# Patient Record
Sex: Male | Born: 2000 | Race: White | Hispanic: No | Marital: Single | Smoking: Current every day smoker
Health system: Southern US, Community
[De-identification: ages and names within clinical notes are randomized; demographics above are authoritative.]

## PROBLEM LIST (undated history)

## (undated) DIAGNOSIS — E785 Hyperlipidemia, unspecified: Secondary | ICD-10-CM

## (undated) DIAGNOSIS — T7840XA Allergy, unspecified, initial encounter: Secondary | ICD-10-CM

## (undated) DIAGNOSIS — I10 Essential (primary) hypertension: Secondary | ICD-10-CM

## (undated) DIAGNOSIS — K5900 Constipation, unspecified: Secondary | ICD-10-CM

## (undated) DIAGNOSIS — F909 Attention-deficit hyperactivity disorder, unspecified type: Secondary | ICD-10-CM

## (undated) DIAGNOSIS — F32A Depression, unspecified: Secondary | ICD-10-CM

## (undated) DIAGNOSIS — F319 Bipolar disorder, unspecified: Secondary | ICD-10-CM

## (undated) DIAGNOSIS — E669 Obesity, unspecified: Secondary | ICD-10-CM

## (undated) DIAGNOSIS — F4310 Post-traumatic stress disorder, unspecified: Secondary | ICD-10-CM

## (undated) DIAGNOSIS — F4010 Social phobia, unspecified: Secondary | ICD-10-CM

## (undated) DIAGNOSIS — F419 Anxiety disorder, unspecified: Secondary | ICD-10-CM

## (undated) DIAGNOSIS — F4000 Agoraphobia, unspecified: Secondary | ICD-10-CM

## (undated) DIAGNOSIS — J45909 Unspecified asthma, uncomplicated: Secondary | ICD-10-CM

## (undated) DIAGNOSIS — F329 Major depressive disorder, single episode, unspecified: Secondary | ICD-10-CM

## (undated) HISTORY — DX: Social phobia, unspecified: F40.10

## (undated) HISTORY — DX: Post-traumatic stress disorder, unspecified: F43.10

## (undated) HISTORY — DX: Agoraphobia, unspecified: F40.00

## (undated) HISTORY — PX: TONSILLECTOMY: SUR1361

## (undated) HISTORY — DX: Bipolar disorder, unspecified: F31.9

## (undated) HISTORY — DX: Anxiety disorder, unspecified: F41.9

## (undated) HISTORY — DX: Hyperlipidemia, unspecified: E78.5

## (undated) HISTORY — DX: Essential (primary) hypertension: I10

## (undated) HISTORY — DX: Depression, unspecified: F32.A

## (undated) HISTORY — PX: CIRCUMCISION: SUR203

---
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
## 2021-01-25 DIAGNOSIS — F4011 Social phobia, generalized: Secondary | ICD-10-CM | POA: Diagnosis not present

## 2021-01-25 DIAGNOSIS — F913 Oppositional defiant disorder: Secondary | ICD-10-CM | POA: Diagnosis not present

## 2021-01-25 DIAGNOSIS — F411 Generalized anxiety disorder: Secondary | ICD-10-CM | POA: Diagnosis not present

## 2021-01-25 DIAGNOSIS — R454 Irritability and anger: Secondary | ICD-10-CM | POA: Diagnosis not present

## 2021-01-25 DIAGNOSIS — F5105 Insomnia due to other mental disorder: Secondary | ICD-10-CM | POA: Diagnosis not present

## 2021-01-25 DIAGNOSIS — F3162 Bipolar disorder, current episode mixed, moderate: Secondary | ICD-10-CM | POA: Diagnosis not present

## 2021-01-25 DIAGNOSIS — F902 Attention-deficit hyperactivity disorder, combined type: Secondary | ICD-10-CM | POA: Diagnosis not present

## 2021-02-02 DIAGNOSIS — R454 Irritability and anger: Secondary | ICD-10-CM | POA: Diagnosis not present

## 2021-02-02 DIAGNOSIS — F3162 Bipolar disorder, current episode mixed, moderate: Secondary | ICD-10-CM | POA: Diagnosis not present

## 2021-02-02 DIAGNOSIS — F913 Oppositional defiant disorder: Secondary | ICD-10-CM | POA: Diagnosis not present

## 2021-02-02 DIAGNOSIS — F411 Generalized anxiety disorder: Secondary | ICD-10-CM | POA: Diagnosis not present

## 2021-02-02 DIAGNOSIS — F4011 Social phobia, generalized: Secondary | ICD-10-CM | POA: Diagnosis not present

## 2021-02-02 DIAGNOSIS — F902 Attention-deficit hyperactivity disorder, combined type: Secondary | ICD-10-CM | POA: Diagnosis not present

## 2021-02-02 DIAGNOSIS — F5105 Insomnia due to other mental disorder: Secondary | ICD-10-CM | POA: Diagnosis not present

## 2021-02-11 DIAGNOSIS — F902 Attention-deficit hyperactivity disorder, combined type: Secondary | ICD-10-CM | POA: Diagnosis not present

## 2021-02-11 DIAGNOSIS — F4011 Social phobia, generalized: Secondary | ICD-10-CM | POA: Diagnosis not present

## 2021-02-11 DIAGNOSIS — F411 Generalized anxiety disorder: Secondary | ICD-10-CM | POA: Diagnosis not present

## 2021-02-11 DIAGNOSIS — F913 Oppositional defiant disorder: Secondary | ICD-10-CM | POA: Diagnosis not present

## 2021-02-11 DIAGNOSIS — F3162 Bipolar disorder, current episode mixed, moderate: Secondary | ICD-10-CM | POA: Diagnosis not present

## 2021-02-11 DIAGNOSIS — R454 Irritability and anger: Secondary | ICD-10-CM | POA: Diagnosis not present

## 2021-02-11 DIAGNOSIS — F5105 Insomnia due to other mental disorder: Secondary | ICD-10-CM | POA: Diagnosis not present

## 2021-03-08 DIAGNOSIS — F4011 Social phobia, generalized: Secondary | ICD-10-CM | POA: Diagnosis not present

## 2021-03-08 DIAGNOSIS — F5105 Insomnia due to other mental disorder: Secondary | ICD-10-CM | POA: Diagnosis not present

## 2021-03-08 DIAGNOSIS — R454 Irritability and anger: Secondary | ICD-10-CM | POA: Diagnosis not present

## 2021-03-08 DIAGNOSIS — F902 Attention-deficit hyperactivity disorder, combined type: Secondary | ICD-10-CM | POA: Diagnosis not present

## 2021-03-08 DIAGNOSIS — F3162 Bipolar disorder, current episode mixed, moderate: Secondary | ICD-10-CM | POA: Diagnosis not present

## 2021-03-08 DIAGNOSIS — F411 Generalized anxiety disorder: Secondary | ICD-10-CM | POA: Diagnosis not present

## 2021-03-08 DIAGNOSIS — F913 Oppositional defiant disorder: Secondary | ICD-10-CM | POA: Diagnosis not present

## 2021-03-29 DIAGNOSIS — F411 Generalized anxiety disorder: Secondary | ICD-10-CM | POA: Diagnosis not present

## 2021-03-29 DIAGNOSIS — F913 Oppositional defiant disorder: Secondary | ICD-10-CM | POA: Diagnosis not present

## 2021-03-29 DIAGNOSIS — F902 Attention-deficit hyperactivity disorder, combined type: Secondary | ICD-10-CM | POA: Diagnosis not present

## 2021-03-29 DIAGNOSIS — F5105 Insomnia due to other mental disorder: Secondary | ICD-10-CM | POA: Diagnosis not present

## 2021-03-29 DIAGNOSIS — F4011 Social phobia, generalized: Secondary | ICD-10-CM | POA: Diagnosis not present

## 2021-03-29 DIAGNOSIS — F3162 Bipolar disorder, current episode mixed, moderate: Secondary | ICD-10-CM | POA: Diagnosis not present

## 2021-03-29 DIAGNOSIS — R454 Irritability and anger: Secondary | ICD-10-CM | POA: Diagnosis not present

## 2021-04-29 DIAGNOSIS — F913 Oppositional defiant disorder: Secondary | ICD-10-CM | POA: Diagnosis not present

## 2021-04-29 DIAGNOSIS — F3162 Bipolar disorder, current episode mixed, moderate: Secondary | ICD-10-CM | POA: Diagnosis not present

## 2021-04-29 DIAGNOSIS — F4011 Social phobia, generalized: Secondary | ICD-10-CM | POA: Diagnosis not present

## 2021-04-29 DIAGNOSIS — F411 Generalized anxiety disorder: Secondary | ICD-10-CM | POA: Diagnosis not present

## 2021-04-29 DIAGNOSIS — F902 Attention-deficit hyperactivity disorder, combined type: Secondary | ICD-10-CM | POA: Diagnosis not present

## 2021-06-22 DIAGNOSIS — F411 Generalized anxiety disorder: Secondary | ICD-10-CM | POA: Diagnosis not present

## 2021-06-22 DIAGNOSIS — F4011 Social phobia, generalized: Secondary | ICD-10-CM | POA: Diagnosis not present

## 2021-06-22 DIAGNOSIS — F902 Attention-deficit hyperactivity disorder, combined type: Secondary | ICD-10-CM | POA: Diagnosis not present

## 2021-06-22 DIAGNOSIS — F3162 Bipolar disorder, current episode mixed, moderate: Secondary | ICD-10-CM | POA: Diagnosis not present

## 2021-06-22 DIAGNOSIS — F913 Oppositional defiant disorder: Secondary | ICD-10-CM | POA: Diagnosis not present

## 2021-07-26 DIAGNOSIS — Z0000 Encounter for general adult medical examination without abnormal findings: Secondary | ICD-10-CM | POA: Diagnosis not present

## 2021-07-26 DIAGNOSIS — J029 Acute pharyngitis, unspecified: Secondary | ICD-10-CM | POA: Diagnosis not present

## 2021-07-26 DIAGNOSIS — E782 Mixed hyperlipidemia: Secondary | ICD-10-CM | POA: Diagnosis not present

## 2021-07-26 DIAGNOSIS — K219 Gastro-esophageal reflux disease without esophagitis: Secondary | ICD-10-CM | POA: Diagnosis not present

## 2021-07-26 DIAGNOSIS — Z136 Encounter for screening for cardiovascular disorders: Secondary | ICD-10-CM | POA: Diagnosis not present

## 2021-08-01 DIAGNOSIS — F902 Attention-deficit hyperactivity disorder, combined type: Secondary | ICD-10-CM | POA: Diagnosis not present

## 2021-08-01 DIAGNOSIS — F4011 Social phobia, generalized: Secondary | ICD-10-CM | POA: Diagnosis not present

## 2021-08-01 DIAGNOSIS — F411 Generalized anxiety disorder: Secondary | ICD-10-CM | POA: Diagnosis not present

## 2021-08-01 DIAGNOSIS — F3162 Bipolar disorder, current episode mixed, moderate: Secondary | ICD-10-CM | POA: Diagnosis not present

## 2021-08-01 DIAGNOSIS — F913 Oppositional defiant disorder: Secondary | ICD-10-CM | POA: Diagnosis not present

## 2021-09-22 DIAGNOSIS — F411 Generalized anxiety disorder: Secondary | ICD-10-CM | POA: Diagnosis not present

## 2021-09-22 DIAGNOSIS — F913 Oppositional defiant disorder: Secondary | ICD-10-CM | POA: Diagnosis not present

## 2021-09-22 DIAGNOSIS — F3162 Bipolar disorder, current episode mixed, moderate: Secondary | ICD-10-CM | POA: Diagnosis not present

## 2021-09-22 DIAGNOSIS — F902 Attention-deficit hyperactivity disorder, combined type: Secondary | ICD-10-CM | POA: Diagnosis not present

## 2021-09-22 DIAGNOSIS — F4011 Social phobia, generalized: Secondary | ICD-10-CM | POA: Diagnosis not present

## 2021-10-19 DIAGNOSIS — F3162 Bipolar disorder, current episode mixed, moderate: Secondary | ICD-10-CM | POA: Diagnosis not present

## 2021-10-19 DIAGNOSIS — F913 Oppositional defiant disorder: Secondary | ICD-10-CM | POA: Diagnosis not present

## 2021-10-19 DIAGNOSIS — F4011 Social phobia, generalized: Secondary | ICD-10-CM | POA: Diagnosis not present

## 2021-10-19 DIAGNOSIS — F902 Attention-deficit hyperactivity disorder, combined type: Secondary | ICD-10-CM | POA: Diagnosis not present

## 2021-10-19 DIAGNOSIS — F411 Generalized anxiety disorder: Secondary | ICD-10-CM | POA: Diagnosis not present

## 2021-11-16 DIAGNOSIS — J029 Acute pharyngitis, unspecified: Secondary | ICD-10-CM | POA: Diagnosis not present

## 2021-11-16 DIAGNOSIS — J020 Streptococcal pharyngitis: Secondary | ICD-10-CM | POA: Diagnosis not present

## 2021-11-17 DIAGNOSIS — F4011 Social phobia, generalized: Secondary | ICD-10-CM | POA: Diagnosis not present

## 2021-11-17 DIAGNOSIS — F5105 Insomnia due to other mental disorder: Secondary | ICD-10-CM | POA: Diagnosis not present

## 2021-11-17 DIAGNOSIS — F4001 Agoraphobia with panic disorder: Secondary | ICD-10-CM | POA: Diagnosis not present

## 2021-11-17 DIAGNOSIS — F902 Attention-deficit hyperactivity disorder, combined type: Secondary | ICD-10-CM | POA: Diagnosis not present

## 2021-11-17 DIAGNOSIS — F3162 Bipolar disorder, current episode mixed, moderate: Secondary | ICD-10-CM | POA: Diagnosis not present

## 2021-11-17 DIAGNOSIS — F411 Generalized anxiety disorder: Secondary | ICD-10-CM | POA: Diagnosis not present

## 2022-01-17 DIAGNOSIS — F411 Generalized anxiety disorder: Secondary | ICD-10-CM | POA: Diagnosis not present

## 2022-01-17 DIAGNOSIS — F5105 Insomnia due to other mental disorder: Secondary | ICD-10-CM | POA: Diagnosis not present

## 2022-01-17 DIAGNOSIS — F4011 Social phobia, generalized: Secondary | ICD-10-CM | POA: Diagnosis not present

## 2022-01-17 DIAGNOSIS — F4001 Agoraphobia with panic disorder: Secondary | ICD-10-CM | POA: Diagnosis not present

## 2022-01-17 DIAGNOSIS — F902 Attention-deficit hyperactivity disorder, combined type: Secondary | ICD-10-CM | POA: Diagnosis not present

## 2022-01-17 DIAGNOSIS — F3162 Bipolar disorder, current episode mixed, moderate: Secondary | ICD-10-CM | POA: Diagnosis not present

## 2022-02-16 ENCOUNTER — Ambulatory Visit: Payer: Medicaid Other | Admitting: Internal Medicine

## 2022-03-03 DIAGNOSIS — H5213 Myopia, bilateral: Secondary | ICD-10-CM | POA: Diagnosis not present

## 2022-03-16 DIAGNOSIS — F902 Attention-deficit hyperactivity disorder, combined type: Secondary | ICD-10-CM | POA: Diagnosis not present

## 2022-03-16 DIAGNOSIS — F4011 Social phobia, generalized: Secondary | ICD-10-CM | POA: Diagnosis not present

## 2022-03-16 DIAGNOSIS — F3162 Bipolar disorder, current episode mixed, moderate: Secondary | ICD-10-CM | POA: Diagnosis not present

## 2022-03-16 DIAGNOSIS — F5105 Insomnia due to other mental disorder: Secondary | ICD-10-CM | POA: Diagnosis not present

## 2022-03-16 DIAGNOSIS — F4001 Agoraphobia with panic disorder: Secondary | ICD-10-CM | POA: Diagnosis not present

## 2022-03-16 DIAGNOSIS — F411 Generalized anxiety disorder: Secondary | ICD-10-CM | POA: Diagnosis not present

## 2022-03-16 DIAGNOSIS — F913 Oppositional defiant disorder: Secondary | ICD-10-CM | POA: Diagnosis not present

## 2022-03-22 DIAGNOSIS — F913 Oppositional defiant disorder: Secondary | ICD-10-CM | POA: Diagnosis not present

## 2022-03-22 DIAGNOSIS — F4001 Agoraphobia with panic disorder: Secondary | ICD-10-CM | POA: Diagnosis not present

## 2022-03-22 DIAGNOSIS — F5105 Insomnia due to other mental disorder: Secondary | ICD-10-CM | POA: Diagnosis not present

## 2022-03-22 DIAGNOSIS — F411 Generalized anxiety disorder: Secondary | ICD-10-CM | POA: Diagnosis not present

## 2022-03-22 DIAGNOSIS — F3162 Bipolar disorder, current episode mixed, moderate: Secondary | ICD-10-CM | POA: Diagnosis not present

## 2022-03-22 DIAGNOSIS — F4011 Social phobia, generalized: Secondary | ICD-10-CM | POA: Diagnosis not present

## 2022-03-22 DIAGNOSIS — F902 Attention-deficit hyperactivity disorder, combined type: Secondary | ICD-10-CM | POA: Diagnosis not present

## 2022-03-30 DIAGNOSIS — J312 Chronic pharyngitis: Secondary | ICD-10-CM | POA: Diagnosis not present

## 2022-04-03 ENCOUNTER — Ambulatory Visit: Payer: Medicaid Other | Admitting: Internal Medicine

## 2022-04-19 DIAGNOSIS — F3162 Bipolar disorder, current episode mixed, moderate: Secondary | ICD-10-CM | POA: Diagnosis not present

## 2022-04-19 DIAGNOSIS — F411 Generalized anxiety disorder: Secondary | ICD-10-CM | POA: Diagnosis not present

## 2022-04-19 DIAGNOSIS — F4001 Agoraphobia with panic disorder: Secondary | ICD-10-CM | POA: Diagnosis not present

## 2022-04-19 DIAGNOSIS — F913 Oppositional defiant disorder: Secondary | ICD-10-CM | POA: Diagnosis not present

## 2022-04-19 DIAGNOSIS — F5105 Insomnia due to other mental disorder: Secondary | ICD-10-CM | POA: Diagnosis not present

## 2022-04-19 DIAGNOSIS — F902 Attention-deficit hyperactivity disorder, combined type: Secondary | ICD-10-CM | POA: Diagnosis not present

## 2022-05-25 ENCOUNTER — Ambulatory Visit: Payer: Medicaid Other | Admitting: Internal Medicine

## 2022-06-07 DIAGNOSIS — F4011 Social phobia, generalized: Secondary | ICD-10-CM | POA: Diagnosis not present

## 2022-06-07 DIAGNOSIS — F913 Oppositional defiant disorder: Secondary | ICD-10-CM | POA: Diagnosis not present

## 2022-06-07 DIAGNOSIS — F4001 Agoraphobia with panic disorder: Secondary | ICD-10-CM | POA: Diagnosis not present

## 2022-06-07 DIAGNOSIS — F5105 Insomnia due to other mental disorder: Secondary | ICD-10-CM | POA: Diagnosis not present

## 2022-06-07 DIAGNOSIS — F902 Attention-deficit hyperactivity disorder, combined type: Secondary | ICD-10-CM | POA: Diagnosis not present

## 2022-06-07 DIAGNOSIS — F3162 Bipolar disorder, current episode mixed, moderate: Secondary | ICD-10-CM | POA: Diagnosis not present

## 2022-06-07 DIAGNOSIS — F411 Generalized anxiety disorder: Secondary | ICD-10-CM | POA: Diagnosis not present

## 2022-07-17 DIAGNOSIS — F5105 Insomnia due to other mental disorder: Secondary | ICD-10-CM | POA: Diagnosis not present

## 2022-07-17 DIAGNOSIS — F913 Oppositional defiant disorder: Secondary | ICD-10-CM | POA: Diagnosis not present

## 2022-07-17 DIAGNOSIS — F4011 Social phobia, generalized: Secondary | ICD-10-CM | POA: Diagnosis not present

## 2022-07-17 DIAGNOSIS — F902 Attention-deficit hyperactivity disorder, combined type: Secondary | ICD-10-CM | POA: Diagnosis not present

## 2022-07-17 DIAGNOSIS — F4001 Agoraphobia with panic disorder: Secondary | ICD-10-CM | POA: Diagnosis not present

## 2022-07-17 DIAGNOSIS — F411 Generalized anxiety disorder: Secondary | ICD-10-CM | POA: Diagnosis not present

## 2022-07-17 DIAGNOSIS — F3162 Bipolar disorder, current episode mixed, moderate: Secondary | ICD-10-CM | POA: Diagnosis not present

## 2022-08-08 DIAGNOSIS — F902 Attention-deficit hyperactivity disorder, combined type: Secondary | ICD-10-CM | POA: Diagnosis not present

## 2022-08-08 DIAGNOSIS — F5105 Insomnia due to other mental disorder: Secondary | ICD-10-CM | POA: Diagnosis not present

## 2022-08-08 DIAGNOSIS — F411 Generalized anxiety disorder: Secondary | ICD-10-CM | POA: Diagnosis not present

## 2022-08-08 DIAGNOSIS — F4001 Agoraphobia with panic disorder: Secondary | ICD-10-CM | POA: Diagnosis not present

## 2022-08-08 DIAGNOSIS — F913 Oppositional defiant disorder: Secondary | ICD-10-CM | POA: Diagnosis not present

## 2022-08-08 DIAGNOSIS — F4011 Social phobia, generalized: Secondary | ICD-10-CM | POA: Diagnosis not present

## 2022-08-08 DIAGNOSIS — F3162 Bipolar disorder, current episode mixed, moderate: Secondary | ICD-10-CM | POA: Diagnosis not present

## 2022-08-10 ENCOUNTER — Ambulatory Visit: Payer: Medicaid Other | Admitting: Family Medicine

## 2022-09-20 DIAGNOSIS — F3162 Bipolar disorder, current episode mixed, moderate: Secondary | ICD-10-CM | POA: Diagnosis not present

## 2022-09-20 DIAGNOSIS — F902 Attention-deficit hyperactivity disorder, combined type: Secondary | ICD-10-CM | POA: Diagnosis not present

## 2022-09-20 DIAGNOSIS — F4001 Agoraphobia with panic disorder: Secondary | ICD-10-CM | POA: Diagnosis not present

## 2022-09-20 DIAGNOSIS — F411 Generalized anxiety disorder: Secondary | ICD-10-CM | POA: Diagnosis not present

## 2022-09-20 DIAGNOSIS — F5105 Insomnia due to other mental disorder: Secondary | ICD-10-CM | POA: Diagnosis not present

## 2022-09-20 DIAGNOSIS — F913 Oppositional defiant disorder: Secondary | ICD-10-CM | POA: Diagnosis not present

## 2022-11-07 DIAGNOSIS — R609 Edema, unspecified: Secondary | ICD-10-CM | POA: Diagnosis not present

## 2022-11-07 DIAGNOSIS — E669 Obesity, unspecified: Secondary | ICD-10-CM | POA: Diagnosis not present

## 2022-11-07 DIAGNOSIS — Z13220 Encounter for screening for lipoid disorders: Secondary | ICD-10-CM | POA: Diagnosis not present

## 2022-11-07 DIAGNOSIS — L409 Psoriasis, unspecified: Secondary | ICD-10-CM | POA: Diagnosis not present

## 2022-11-07 DIAGNOSIS — F319 Bipolar disorder, unspecified: Secondary | ICD-10-CM | POA: Diagnosis not present

## 2022-11-07 DIAGNOSIS — R5383 Other fatigue: Secondary | ICD-10-CM | POA: Diagnosis not present

## 2022-11-16 DIAGNOSIS — E785 Hyperlipidemia, unspecified: Secondary | ICD-10-CM | POA: Diagnosis not present

## 2023-03-08 ENCOUNTER — Ambulatory Visit: Payer: Self-pay | Admitting: Nurse Practitioner

## 2023-04-09 ENCOUNTER — Other Ambulatory Visit: Payer: Self-pay

## 2023-04-09 ENCOUNTER — Ambulatory Visit (INDEPENDENT_AMBULATORY_CARE_PROVIDER_SITE_OTHER): Payer: Medicaid Other | Admitting: Nurse Practitioner

## 2023-04-09 ENCOUNTER — Encounter: Payer: Self-pay | Admitting: Nurse Practitioner

## 2023-04-09 VITALS — BP 120/72 | HR 77 | Temp 97.9°F | Resp 18 | Ht 72.0 in | Wt 328.0 lb

## 2023-04-09 DIAGNOSIS — Z130 Encounter for screening for diseases of the blood and blood-forming organs and certain disorders involving the immune mechanism: Secondary | ICD-10-CM

## 2023-04-09 DIAGNOSIS — F4310 Post-traumatic stress disorder, unspecified: Secondary | ICD-10-CM | POA: Insufficient documentation

## 2023-04-09 DIAGNOSIS — F319 Bipolar disorder, unspecified: Secondary | ICD-10-CM | POA: Diagnosis not present

## 2023-04-09 DIAGNOSIS — Z13220 Encounter for screening for lipoid disorders: Secondary | ICD-10-CM

## 2023-04-09 DIAGNOSIS — E785 Hyperlipidemia, unspecified: Secondary | ICD-10-CM | POA: Insufficient documentation

## 2023-04-09 DIAGNOSIS — F419 Anxiety disorder, unspecified: Secondary | ICD-10-CM | POA: Insufficient documentation

## 2023-04-09 DIAGNOSIS — Z79899 Other long term (current) drug therapy: Secondary | ICD-10-CM | POA: Diagnosis not present

## 2023-04-09 DIAGNOSIS — Z131 Encounter for screening for diabetes mellitus: Secondary | ICD-10-CM

## 2023-04-09 DIAGNOSIS — E162 Hypoglycemia, unspecified: Secondary | ICD-10-CM

## 2023-04-09 DIAGNOSIS — K219 Gastro-esophageal reflux disease without esophagitis: Secondary | ICD-10-CM | POA: Insufficient documentation

## 2023-04-09 DIAGNOSIS — L409 Psoriasis, unspecified: Secondary | ICD-10-CM | POA: Insufficient documentation

## 2023-04-09 DIAGNOSIS — Z6841 Body Mass Index (BMI) 40.0 and over, adult: Secondary | ICD-10-CM

## 2023-04-09 DIAGNOSIS — F4000 Agoraphobia, unspecified: Secondary | ICD-10-CM

## 2023-04-09 DIAGNOSIS — Z7689 Persons encountering health services in other specified circumstances: Secondary | ICD-10-CM

## 2023-04-09 DIAGNOSIS — M25532 Pain in left wrist: Secondary | ICD-10-CM

## 2023-04-09 DIAGNOSIS — Z114 Encounter for screening for human immunodeficiency virus [HIV]: Secondary | ICD-10-CM

## 2023-04-09 DIAGNOSIS — Z1159 Encounter for screening for other viral diseases: Secondary | ICD-10-CM

## 2023-04-09 MED ORDER — BETAMETHASONE VALERATE 0.1 % EX OINT
1.0000 | TOPICAL_OINTMENT | Freq: Two times a day (BID) | CUTANEOUS | 0 refills | Status: AC
Start: 2023-04-09 — End: ?

## 2023-04-09 MED ORDER — OMEPRAZOLE 20 MG PO CPDR
20.0000 mg | DELAYED_RELEASE_CAPSULE | Freq: Every day | ORAL | 3 refills | Status: DC
Start: 2023-04-09 — End: 2023-05-10

## 2023-04-09 MED ORDER — CLOBETASOL PROPIONATE 0.05 % EX SOLN
1.0000 | Freq: Two times a day (BID) | CUTANEOUS | 3 refills | Status: DC
Start: 2023-04-09 — End: 2023-10-17

## 2023-04-09 NOTE — Assessment & Plan Note (Signed)
Managed by psychiatry 

## 2023-04-09 NOTE — Assessment & Plan Note (Signed)
Getting labs today.

## 2023-04-09 NOTE — Assessment & Plan Note (Signed)
Waiting for dermatology appointment, previously on a shampoo and steroid cream.  Will send one in while they wait for derm.

## 2023-04-09 NOTE — Progress Notes (Signed)
BP 120/72   Pulse 77   Temp 97.9 F (36.6 C) (Oral)   Resp 18   Ht 6' (1.829 m)   Wt (!) 328 lb (148.8 kg)   SpO2 98%   BMI 44.48 kg/m    Subjective:    Patient ID: Gerald Davis, male    DOB: [birth year], 22 y.o.   MRN: [id number]  HPI: Gerald Davis is a 22 y.o. male  Chief Complaint  Patient presents with   Establish Care   Establish care: his last physical was 6 months ago.  Medical history includes behavioral health and hld.  Family history includes DM, HTN.  Health Maintenance getting labs.   Bipolar/agoraphobia/adhd/PTSD/Anxiety:  managed by [facility].  Currently on strattera, doxepin, gabapentin, seroquel, belsomra, and trintellix.     04/09/2023    1:39 PM  Depression screen PHQ 2/9  Decreased Interest 0  Down, Depressed, Hopeless 0  PHQ - 2 Score 0  Altered sleeping 3  Tired, decreased energy 2  Change in appetite 0  Feeling bad or failure about yourself  0  Trouble concentrating 2  Moving slowly or fidgety/restless 0  Suicidal thoughts 0  PHQ-9 Score 7  Difficult doing work/chores Somewhat difficult       04/09/2023    1:40 PM  GAD 7 : Generalized Anxiety Score  Nervous, Anxious, on Edge 2  Control/stop worrying 0  Worry too much - different things 0  Trouble relaxing 1  Restless 0  Easily annoyed or irritable 2  Afraid - awful might happen 0  Total GAD 7 Score 5  Anxiety Difficulty Somewhat difficult    Obesity:  Current weight : 328 lbs BMI: 44.48 Treatment Tried: none Comorbidities: HLD   Reports HLD but have no records will get labs today.   Reports he has been having low blood sugar readings.  Mother reports that he does get dizzy.  Will get labs.   GERD:  he has been complaining of chest burning.  He has not been on acid reflux medication for awhile.  Will restart omeprazole.   Left wrist pain: patient reports that he has had left wrist pain for some time.  He also has a history of psoriasis.   He denies any  trauma.  Will get labs to check for psoriasis arthritis.    Relevant past medical, surgical, family and social history reviewed and updated as indicated. Interim medical history since our last visit reviewed. Allergies and medications reviewed and updated.  Review of Systems  Constitutional: Negative for fever or weight change.  Respiratory: Negative for cough and shortness of breath.   Cardiovascular: Negative for chest pain or palpitations.  Gastrointestinal: Negative for abdominal pain, no bowel changes.  Musculoskeletal: Negative for gait problem or joint swelling. Left wrist pain Skin: Negative for rash.  Neurological: Negative for dizziness or headache.  No other specific complaints in a complete review of systems (except as listed in HPI above).      Objective:    BP 120/72   Pulse 77   Temp 97.9 F (36.6 C) (Oral)   Resp 18   Ht 6' (1.829 m)   Wt (!) 328 lb (148.8 kg)   SpO2 98%   BMI 44.48 kg/m   Wt Readings from Last 3 Encounters:  04/09/23 (!) 328 lb (148.8 kg)    Physical Exam  Constitutional: Patient appears well-developed and well-nourished. Obese  No distress.  HEENT: head atraumatic, normocephalic, pupils equal and reactive to light,  neck supple, throat within normal limits Cardiovascular: Normal rate, regular rhythm and normal heart sounds.  No murmur heard. No BLE edema. Pulmonary/Chest: Effort normal and breath sounds normal. No respiratory distress. Abdominal: Soft.  There is no tenderness. Psychiatric: Patient has a normal mood and affect. behavior is normal. Judgment and thought content normal.  No results found for this or any previous visit.    Assessment & Plan:   Problem List Items Addressed This Visit       Digestive   Gastroesophageal reflux disease without esophagitis    Will restart omeprazole , recommend avoiding triggers      Relevant Medications   omeprazole (PRILOSEC) 20 MG capsule     Musculoskeletal and Integument   Psoriasis     Waiting for dermatology appointment, previously on a shampoo and steroid cream.  Will send one in while they wait for derm.      Relevant Medications   clobetasol (TEMOVATE) 0.05 % external solution   betamethasone valerate ointment (VALISONE) 0.1 %   Other Relevant Orders   Sedimentation rate   C-reactive protein   ANA   Rheumatoid Factor   Cyclic citrul peptide antibody, IgG     Other   Agoraphobia    Managed by psychiatry      Relevant Medications   doxepin (SINEQUAN) 50 MG capsule   vortioxetine HBr (TRINTELLIX) 10 MG TABS tablet   PTSD (post-traumatic stress disorder)    Managed by psychiatry      Relevant Medications   doxepin (SINEQUAN) 50 MG capsule   vortioxetine HBr (TRINTELLIX) 10 MG TABS tablet   Anxiety    Managed by psychiatry      Relevant Medications   doxepin (SINEQUAN) 50 MG capsule   vortioxetine HBr (TRINTELLIX) 10 MG TABS tablet   Bipolar 1 disorder (HCC) - Primary    Managed by psychiatry      Hyperlipidemia    Getting labs today      Class 3 severe obesity due to excess calories with serious comorbidity and body mass index (BMI) of 40.0 to 44.9 in adult [facility])    Getting labs, work on lifestyle modification.       Relevant Medications   atomoxetine (STRATTERA) 40 MG capsule   Other Relevant Orders   TSH   Other Visit Diagnoses     High risk medication use       Relevant Orders   CBC with Differential/Platelet   COMPLETE METABOLIC PANEL WITH GFR   Lipid panel   Hemoglobin A1c   Encounter to establish care       Screening for diabetes mellitus       Relevant Orders   COMPLETE METABOLIC PANEL WITH GFR   Hemoglobin A1c   Screening for deficiency anemia       Relevant Orders   CBC with Differential/Platelet   Screening for cholesterol level       Relevant Orders   Lipid panel   Screening for HIV without presence of risk factors       Relevant Orders   HIV Antibody (routine testing w rflx)   Encounter for hepatitis C  screening test for low risk patient       Relevant Orders   Hepatitis C antibody   Left wrist pain       getting labs, hx of psoriasis   Relevant Orders   Sedimentation rate   C-reactive protein   ANA   Rheumatoid Factor   Cyclic citrul peptide antibody, IgG  Hypoglycemia       reports low blood sugar will get labs.        Follow up plan: Return in about 1 year (around 04/08/2024) for cpe.

## 2023-04-09 NOTE — Assessment & Plan Note (Addendum)
Will restart omeprazole , recommend avoiding triggers

## 2023-04-09 NOTE — Assessment & Plan Note (Signed)
Getting labs, work on lifestyle modification.

## 2023-04-10 ENCOUNTER — Encounter: Payer: Self-pay | Admitting: Otolaryngology

## 2023-04-12 LAB — CBC WITH DIFFERENTIAL/PLATELET
Absolute Monocytes: 450 {cells}/uL (ref 200–950)
Basophils Absolute: 68 {cells}/uL (ref 0–200)
Basophils Relative: 0.9 %
Eosinophils Absolute: 158 {cells}/uL (ref 15–500)
Eosinophils Relative: 2.1 %
HCT: 45.7 % (ref 38.5–50.0)
Hemoglobin: 15.2 g/dL (ref 13.2–17.1)
Lymphs Abs: 1950 cells/uL (ref 850–3900)
MCH: 26.7 pg — ABNORMAL LOW (ref 27.0–33.0)
MCHC: 33.3 g/dL (ref 32.0–36.0)
MCV: 80.3 fL (ref 80.0–100.0)
MPV: 11.2 fL (ref 7.5–12.5)
Monocytes Relative: 6 %
Neutro Abs: 4875 {cells}/uL (ref 1500–7800)
Neutrophils Relative %: 65 %
Platelets: 285 10*3/uL (ref 140–400)
RBC: 5.69 10*6/uL (ref 4.20–5.80)
RDW: 13 % (ref 11.0–15.0)
Total Lymphocyte: 26 %
WBC: 7.5 10*3/uL (ref 3.8–10.8)

## 2023-04-12 LAB — COMPLETE METABOLIC PANEL WITH GFR
AG Ratio: 1.6 (calc) (ref 1.0–2.5)
ALT: 27 U/L (ref 9–46)
AST: 20 U/L (ref 10–40)
Albumin: 4.4 g/dL (ref 3.6–5.1)
Alkaline phosphatase (APISO): 95 U/L (ref 36–130)
BUN: 12 mg/dL (ref 7–25)
CO2: 25 mmol/L (ref 20–32)
Calcium: 9.7 mg/dL (ref 8.6–10.3)
Chloride: 103 mmol/L (ref 98–110)
Creat: 1 mg/dL (ref 0.60–1.24)
Globulin: 2.8 g/dL (ref 1.9–3.7)
Glucose, Bld: 89 mg/dL (ref 65–99)
Potassium: 4.5 mmol/L (ref 3.5–5.3)
Sodium: 138 mmol/L (ref 135–146)
Total Bilirubin: 0.8 mg/dL (ref 0.2–1.2)
Total Protein: 7.2 g/dL (ref 6.1–8.1)
eGFR: 110 mL/min/{1.73_m2} (ref >=60)

## 2023-04-12 LAB — RHEUMATOID FACTOR: Rheumatoid fact SerPl-aCnc: <10 IU/mL (ref <14)

## 2023-04-12 LAB — ANA: Anti Nuclear Antibody (ANA): NEGATIVE

## 2023-04-12 LAB — TEST AUTHORIZATION

## 2023-04-12 LAB — HEMOGLOBIN A1C
Hgb A1c MFr Bld: 5.8 %{Hb} — ABNORMAL HIGH (ref <5.7)
Mean Plasma Glucose: 120 mg/dL
eAG (mmol/L): 6.6 mmol/L

## 2023-04-12 LAB — CYCLIC CITRUL PEPTIDE ANTIBODY, IGG: Cyclic Citrullin Peptide Ab: <16 U

## 2023-04-12 LAB — HIV ANTIBODY (ROUTINE TESTING W REFLEX): HIV 1&2 Ab, 4th Generation: NONREACTIVE

## 2023-04-12 LAB — LIPID PANEL
Cholesterol: 183 mg/dL (ref <200)
HDL: 32 mg/dL — ABNORMAL LOW (ref >=40)
LDL Cholesterol (Calc): 126 mg/dL — ABNORMAL HIGH
Non-HDL Cholesterol (Calc): 151 mg/dL — ABNORMAL HIGH (ref <130)
Total CHOL/HDL Ratio: 5.7 (calc) — ABNORMAL HIGH (ref <5.0)
Triglycerides: 133 mg/dL (ref <150)

## 2023-04-12 LAB — HEPATITIS C ANTIBODY: Hepatitis C Ab: NONREACTIVE

## 2023-04-12 LAB — SEDIMENTATION RATE: Sed Rate: 17 mm/h — ABNORMAL HIGH (ref 0–15)

## 2023-04-12 LAB — C-REACTIVE PROTEIN: CRP: 18 mg/L — ABNORMAL HIGH (ref <8.0)

## 2023-04-12 LAB — TSH: TSH: 1.87 m[IU]/L (ref 0.40–4.50)

## 2023-05-05 ENCOUNTER — Other Ambulatory Visit: Payer: Self-pay

## 2023-05-05 ENCOUNTER — Encounter: Payer: Self-pay | Admitting: Emergency Medicine

## 2023-05-05 ENCOUNTER — Emergency Department: Payer: Medicaid Other

## 2023-05-05 ENCOUNTER — Emergency Department
Admission: EM | Admit: 2023-05-05 | Discharge: 2023-05-05 | Disposition: A | Discharge status: Home / Self Care | Payer: Medicaid Other | Attending: Emergency Medicine | Admitting: Emergency Medicine

## 2023-05-05 DIAGNOSIS — Z79899 Other long term (current) drug therapy: Secondary | ICD-10-CM | POA: Insufficient documentation

## 2023-05-05 DIAGNOSIS — I10 Essential (primary) hypertension: Secondary | ICD-10-CM | POA: Insufficient documentation

## 2023-05-05 DIAGNOSIS — K219 Gastro-esophageal reflux disease without esophagitis: Secondary | ICD-10-CM | POA: Diagnosis not present

## 2023-05-05 DIAGNOSIS — Z7951 Long term (current) use of inhaled steroids: Secondary | ICD-10-CM | POA: Diagnosis not present

## 2023-05-05 DIAGNOSIS — J45909 Unspecified asthma, uncomplicated: Secondary | ICD-10-CM | POA: Diagnosis not present

## 2023-05-05 DIAGNOSIS — R072 Precordial pain: Secondary | ICD-10-CM | POA: Diagnosis present

## 2023-05-05 DIAGNOSIS — R079 Chest pain, unspecified: Secondary | ICD-10-CM

## 2023-05-05 LAB — CBC WITH DIFFERENTIAL/PLATELET
Abs Immature Granulocytes: 0.04 10*3/uL (ref 0.00–0.07)
Basophils Absolute: 0.1 10*3/uL (ref 0.0–0.1)
Basophils Relative: 1 %
Eosinophils Absolute: 0.3 10*3/uL (ref 0.0–0.5)
Eosinophils Relative: 3 %
HCT: 44.1 % (ref 39.0–52.0)
Hemoglobin: 14.3 g/dL (ref 13.0–17.0)
Immature Granulocytes: 1 %
Lymphocytes Relative: 29 %
Lymphs Abs: 2.4 10*3/uL (ref 0.7–4.0)
MCH: 27.1 pg (ref 26.0–34.0)
MCHC: 32.4 g/dL (ref 30.0–36.0)
MCV: 83.7 fL (ref 80.0–100.0)
Monocytes Absolute: 0.6 10*3/uL (ref 0.1–1.0)
Monocytes Relative: 7 %
Neutro Abs: 5.1 10*3/uL (ref 1.7–7.7)
Neutrophils Relative %: 59 %
Platelets: 287 10*3/uL (ref 150–400)
RBC: 5.27 MIL/uL (ref 4.22–5.81)
RDW: 13 % (ref 11.5–15.5)
WBC: 8.5 10*3/uL (ref 4.0–10.5)
nRBC: 0 % (ref 0.0–0.2)

## 2023-05-05 LAB — COMPREHENSIVE METABOLIC PANEL
ALT: 32 U/L (ref 0–44)
AST: 24 U/L (ref 15–41)
Albumin: 4 g/dL (ref 3.5–5.0)
Alkaline Phosphatase: 82 U/L (ref 38–126)
Anion gap: 10 (ref 5–15)
BUN: 12 mg/dL (ref 6–20)
CO2: 28 mmol/L (ref 22–32)
Calcium: 9.3 mg/dL (ref 8.9–10.3)
Chloride: 100 mmol/L (ref 98–111)
Creatinine, Ser: 1.17 mg/dL (ref 0.61–1.24)
GFR, Estimated: >60 mL/min (ref >60)
Glucose, Bld: 89 mg/dL (ref 70–99)
Potassium: 3.9 mmol/L (ref 3.5–5.1)
Sodium: 138 mmol/L (ref 135–145)
Total Bilirubin: 0.9 mg/dL (ref 0.3–1.2)
Total Protein: 7.9 g/dL (ref 6.5–8.1)

## 2023-05-05 LAB — TROPONIN I (HIGH SENSITIVITY)
Troponin I (High Sensitivity): <2 ng/L (ref <18)
Troponin I (High Sensitivity): <2 ng/L (ref <18)

## 2023-05-05 LAB — LIPASE, BLOOD: Lipase: 32 U/L (ref 11–51)

## 2023-05-05 MED ORDER — ALUM & MAG HYDROXIDE-SIMETH 200-200-20 MG/5ML PO SUSP
30.0000 mL | Freq: Once | ORAL | Status: AC
  Administered 2023-05-05: 30 mL via ORAL
  Filled 2023-05-05: qty 30

## 2023-05-05 MED ORDER — FAMOTIDINE 20 MG PO TABS: 20.0000 mg | ORAL_TABLET | Freq: Two times a day (BID) | ORAL | 0 refills | Status: DC

## 2023-05-05 MED ORDER — LIDOCAINE VISCOUS HCL 2 % MT SOLN
15.0000 mL | Freq: Once | OROMUCOSAL | Status: AC
  Administered 2023-05-05: 15 mL via OROMUCOSAL
  Filled 2023-05-05: qty 15

## 2023-05-05 NOTE — Discharge Instructions (Signed)
Start Pepcid 20 mg twice daily.  Return to the ER for worsening symptoms, persistent vomiting, difficulty breathing or other concerns.

## 2023-05-05 NOTE — ED Triage Notes (Signed)
Pt presents ambulatory to triage via POV with complaints of mid-sternal CP that started tonight. Pt endorses reflux as a child. He describes the discomfort as "burning sensation" without radiation. Hx of anxiety. A&Ox4 at this time. Denies SOB.

## 2023-05-05 NOTE — ED Provider Notes (Signed)
[facility] Provider Note    Event Date/Time   First MD Initiated Contact with Patient 05/05/23 [phone]     (approximate)   History   Chest Pain   HPI  Gerald Davis is a 22 y.o. male who presents to the ED from home with a chief complaint of burning, midsternal chest pain, nonradiating which began around 2 AM, not associated with diaphoresis, shortness of breath, palpitations, nausea/vomiting or dizziness.  History of reflux as a child.  Does not currently take reflux medications.  Denies recent fever/chills, cough.  Denies recent travel, trauma or hormone use.  Denies EtOH or illicit drug use.     Past Medical History   Past Medical History:  Diagnosis Date   ADHD (attention deficit hyperactivity disorder)    Agoraphobia    Allergy    Anxiety    Asthma    well controlled per mom   Bipolar 1 disorder (HCC)    Bipolar disorder (HCC)    Constipation    Depression    Hyperlipidemia    Hypertension    Obesity    PTSD (post-traumatic stress disorder)    Social anxiety disorder      Active Problem List   Patient Active Problem List   Diagnosis Date Noted   Agoraphobia 04/09/2023   PTSD (post-traumatic stress disorder) 04/09/2023   Anxiety 04/09/2023   Bipolar 1 disorder (HCC) 04/09/2023   Hyperlipidemia 04/09/2023   Psoriasis 04/09/2023   Gastroesophageal reflux disease without esophagitis 04/09/2023   Class 3 severe obesity due to excess calories with serious comorbidity and body mass index (BMI) of 40.0 to 44.9 in adult (HCC) 04/09/2023   ODD (oppositional defiant disorder) 04/08/2014   PTSD (post-traumatic stress disorder) 04/08/2014   ADD (attention deficit disorder) 04/08/2014   Migraine without aura and without status migrainosus, not intractable 04/08/2014     Past Surgical History   Past Surgical History:  Procedure Laterality Date   CIRCUMCISION  2003   TONSILLECTOMY     TONSILLECTOMY AND ADENOIDECTOMY Bilateral 08/08/2017    Procedure: TONSILLECTOMY AND POSSIBLE ADENOIDECTOMY;  Surgeon: Geanie Logan, MD;  Location: [facility] ORS;  Service: ENT;  Laterality: Bilateral;     Home Medications   Prior to Admission medications   Medication Sig Start Date End Date Taking? Authorizing Provider  famotidine (PEPCID) 20 MG tablet Take 1 tablet (20 mg total) by mouth 2 (two) times daily. 05/05/23  Yes Irean Hong, MD  acetaminophen (TYLENOL) 325 MG tablet Take 650 mg by mouth daily as needed for headache.    [provider]  albuterol (PROVENTIL HFA;VENTOLIN HFA) 108 (90 Base) MCG/ACT inhaler Inhale 2 puffs into the lungs every 6 (six) hours as needed for wheezing or shortness of breath.    [provider]  atomoxetine (STRATTERA) 40 MG capsule Take 40 mg by mouth daily.    [provider]  betamethasone valerate ointment (VALISONE) 0.1 % Apply 1 Application topically 2 (two) times daily. 04/09/23   Berniece Salines, FNP  Brexpiprazole (REXULTI) 1 MG TABS Take 1 mg by mouth at bedtime.    [provider]  cephALEXin (KEFLEX) 500 MG capsule Take 1 capsule (500 mg total) by mouth 3 (three) times daily. 05/04/18   Tommi Rumps, PA-C  clobetasol (TEMOVATE) 0.05 % external solution Apply 1 Application topically 2 (two) times daily. 04/09/23   Berniece Salines, FNP  cloNIDine (CATAPRES) 0.1 MG tablet Take 0.1 mg by mouth at bedtime. FOR SLEEP  [provider]  docusate sodium (COLACE) 100 MG capsule Take 100 mg by mouth daily.    [provider]  doxepin (SINEQUAN) 50 MG capsule Take 50 mg by mouth at bedtime.    [provider]  escitalopram (LEXAPRO) 10 MG tablet Take 10 mg by mouth every morning.     [provider]  gabapentin (NEURONTIN) 100 MG capsule Take 100 mg by mouth 2 (two) times daily.    [provider]  lisdexamfetamine (VYVANSE) 40 MG capsule Take 40 mg by mouth every morning.    [provider]  Multiple Vitamin (MULTIVITAMIN  WITH MINERALS) TABS tablet Take 1 tablet by mouth at bedtime.    [provider]  omeprazole (PRILOSEC) 20 MG capsule Take 1 capsule (20 mg total) by mouth daily. 04/09/23   Berniece Salines, FNP  QUEtiapine (SEROQUEL) 300 MG tablet Take 300 mg by mouth 2 (two) times daily.    [provider]  ranitidine (ZANTAC) 150 MG tablet Take 150 mg by mouth 2 (two) times daily.    [provider]  Suvorexant (BELSOMRA) 20 MG TABS Take 20 mg by mouth at bedtime as needed.    [provider]  vitamin C (ASCORBIC ACID) 500 MG tablet Take 500 mg by mouth at bedtime.    [provider]  vortioxetine HBr (TRINTELLIX) 10 MG TABS tablet Take 10 mg by mouth daily.    [provider]     Allergies  Ambien [zolpidem], Risperidone and related, Ambien [zolpidem tartrate], and Risperdal [risperidone]   Family History   Family History  Problem Relation Age of Onset   Depression Mother    Anxiety disorder Mother    COPD Mother    Hyperlipidemia Mother    Hypertension Mother    OCD Mother    COPD Father    Hyperlipidemia Father    Hypertension Father    Anxiety disorder Father    Bipolar disorder Father    OCD Father    High blood pressure Mother    Migraines Mother    High blood pressure Father    Depression Father    Parkinson's disease Maternal Grandfather    Cirrhosis Maternal Grandfather    Dementia Maternal Grandfather    Migraines Maternal Grandfather    Depression Paternal Grandmother    Heart attack Paternal Grandfather      Physical Exam  Triage Vital Signs: ED Triage Vitals  Encounter Vitals Group     BP 05/05/23 0114 (!) 141/94     Systolic BP Percentile --      Diastolic BP Percentile --      Pulse Rate 05/05/23 0114 (!) 59     Resp 05/05/23 0114 18     Temp 05/05/23 0114 98.4 F (36.9 C)     Temp Source 05/05/23 0114 Oral     SpO2 05/05/23 0114 96 %     Weight 05/05/23 0106 (!) 330 lb (149.7 kg)     Height 05/05/23 0106  6' (1.829 m)     Head Circumference --      Peak Flow --      Pain Score 05/05/23 0106 3     Pain Loc --      Pain Education --      Exclude from Growth Chart --     Updated Vital Signs: BP (!) 164/144   Pulse (!) 57   Temp 98.4 F (36.9 C) (Oral)   Resp 18   Ht 6' (  1.829 m)   Wt (!) 149.7 kg   SpO2 100%   BMI 44.76 kg/m    General: Awake, no distress.  CV:  RRR.  Good peripheral perfusion.  Resp:  Normal effort.  CTAB. Abd:  Nontender to light or deep palpation.  No distention.  Other:  No truncal vesicles.  Bilateral calves are supple and nontender.   ED Results / Procedures / Treatments  Labs (all labs ordered are listed, but only abnormal results are displayed) Labs Reviewed  LIPASE, BLOOD  COMPREHENSIVE METABOLIC PANEL  CBC WITH DIFFERENTIAL/PLATELET  TROPONIN I (HIGH SENSITIVITY)  TROPONIN I (HIGH SENSITIVITY)     EKG  ED ECG REPORT I, Jailen Coward J, the attending physician, personally viewed and interpreted this ECG.   Date: 05/05/2023  EKG Time: 0111  Rate: 54  Rhythm: sinus bradycardia  Axis: Normal  Intervals:none  ST&T Change: Nonspecific    RADIOLOGY I have independently visualized and interpreted patient's imaging study as well as noted the radiology interpretation:  X-ray: No acute cardiopulmonary process  Official radiology report(s): DG Chest 2 View  Result Date: 05/05/2023 CLINICAL DATA:  CP EXAM: CHEST - 2 VIEW COMPARISON:  None Available. FINDINGS: The heart and mediastinal contours are within normal limits. No focal consolidation. No pulmonary edema. No pleural effusion. No pneumothorax. No acute osseous abnormality. IMPRESSION: No active cardiopulmonary disease. Electronically Signed   By: Tish Frederickson M.D.   On: 05/05/2023 02:01     PROCEDURES:  Critical Care performed: No  .1-3 Lead EKG Interpretation  Performed by: Irean Hong, MD Authorized by: Irean Hong, MD     Interpretation: normal     ECG rate:  60   ECG  rate assessment: normal     Rhythm: sinus rhythm     Ectopy: none     Conduction: normal   Comments:     Patient placed on cardiac monitor to evaluate for arrhythmias    MEDICATIONS ORDERED IN ED: Medications  alum & mag hydroxide-simeth (MAALOX/MYLANTA) 200-200-20 MG/5ML suspension 30 mL (30 mLs Oral Given 05/05/23 0338)  lidocaine (XYLOCAINE) 2 % viscous mouth solution 15 mL (15 mLs Mouth/Throat Given 05/05/23 0338)     IMPRESSION / MDM / ASSESSMENT AND PLAN / ED COURSE  I reviewed the triage vital signs and the nursing notes.                             22 year old male presenting with midsternal burning chest pain. Differential diagnosis includes, but is not limited to, ACS, aortic dissection, pulmonary embolism, cardiac tamponade, pneumothorax, pneumonia, pericarditis, myocarditis, GI-related causes including esophagitis/gastritis, and musculoskeletal chest wall pain.   I personally reviewed patient's records and note a PCP office visit on 04/09/2023 for chronic medical problems.  Patient's presentation is most consistent with acute presentation with potential threat to life or bodily function.  The patient is on the cardiac monitor to evaluate for evidence of arrhythmia and/or significant heart rate changes.  Laboratory results unremarkable, awaiting repeat troponin.  Chest x-ray is clear.  Will administer GI cocktail and reassess.  Clinical Course as of 05/05/23 0542  Sat May 05, 2023  0435 Repeat troponin negative.  Improved after GI cocktail.  Recommend Pepcid and will refer to GI for outpatient follow-up.  Strict return precautions given.  Patient and family member verbalized understanding agree with plan of care. [JS]    Clinical Course User Index [JS] Irean Hong, MD  FINAL CLINICAL IMPRESSION(S) / ED DIAGNOSES   Final diagnoses:  Nonspecific chest pain  Gastroesophageal reflux disease, unspecified whether esophagitis present     Rx / DC Orders   ED  Discharge Orders          Ordered    famotidine (PEPCID) 20 MG tablet  2 times daily        05/05/23 0436             Note:  This document was prepared using Dragon voice recognition software and may include unintentional dictation errors.   Irean Hong, MD 05/05/23 [phone]

## 2023-05-10 ENCOUNTER — Other Ambulatory Visit: Payer: Self-pay

## 2023-05-10 ENCOUNTER — Ambulatory Visit (INDEPENDENT_AMBULATORY_CARE_PROVIDER_SITE_OTHER): Payer: Medicaid Other | Admitting: Nurse Practitioner

## 2023-05-10 VITALS — BP 118/76 | HR 72 | Temp 98.2°F | Resp 18 | Ht 72.0 in | Wt 328.0 lb

## 2023-05-10 DIAGNOSIS — R9431 Abnormal electrocardiogram [ECG] [EKG]: Secondary | ICD-10-CM | POA: Diagnosis not present

## 2023-05-10 DIAGNOSIS — K219 Gastro-esophageal reflux disease without esophagitis: Secondary | ICD-10-CM | POA: Diagnosis not present

## 2023-05-10 MED ORDER — OMEPRAZOLE 40 MG PO CPDR: 40.0000 mg | DELAYED_RELEASE_CAPSULE | Freq: Every day | ORAL | 0 refills | Status: DC

## 2023-05-10 NOTE — Progress Notes (Signed)
BP 118/76   Pulse 72   Temp 98.2 F (36.8 C) (Oral)   Resp 18   Ht 6' (1.829 m)   Wt (!) 328 lb (148.8 kg)   SpO2 97%   BMI 44.48 kg/m    Subjective:    Patient ID: Gerald Davis, male    DOB: 09-09-2000, 22 y.o.   MRN: [id number]  HPI: Gerald Davis is a 22 y.o. male  Chief Complaint  Patient presents with   Chest Pain   Gastroesophageal Reflux   GERD- was restarted on omeprazole 20 mg daily on 04/09/2023.  recently seen in er, treated with gi cocktail started on pepcid 20 mg BID,  er note says referred to gi but I do not see the referral. Will place referral. Patient also reports no improvement since starting pepcid, will increase omeprazole to 40 mg daily.  GERD control status: uncontrolledSatisfied with current treatment? no Heartburn frequency: every day Medication side effects: no  Medication compliance: stable Previous GERD medications:omeprazole 20 mg daily Antacid use frequency:  tums last night Duration: 20 min Nature: burning Location: chest Heartburn duration: 20 min Alleviatiating factors:  nothing, it goes away on its own Aggravating factors: pressure Dysphagia: no Odynophagia:  no Hematemesis: no Blood in stool: no EGD: no   Abnormal EKG:  had EKG done in er.   Was sinus bradycardia, family history of cardiac issues. Patient denies any chest pain at this time, patient heart rate in office is normal. Will place referral to cardiology.    Relevant past medical, surgical, family and social history reviewed and updated as indicated. Interim medical history since our last visit reviewed. Allergies and medications reviewed and updated.  Review of Systems  Constitutional: Negative for fever or weight change.  Respiratory: Negative for cough and shortness of breath.   Cardiovascular: positive for chest pain ,negative for  palpitations.  Gastrointestinal: Negative for abdominal pain, no bowel changes.  Musculoskeletal: Negative for gait problem or joint  swelling.  Skin: Negative for rash.  Neurological: Negative for dizziness or headache.  No other specific complaints in a complete review of systems (except as listed in HPI above).      Objective:    BP 118/76   Pulse 72   Temp 98.2 F (36.8 C) (Oral)   Resp 18   Ht 6' (1.829 m)   Wt (!) 328 lb (148.8 kg)   SpO2 97%   BMI 44.48 kg/m   Wt Readings from Last 3 Encounters:  05/10/23 (!) 328 lb (148.8 kg)  05/05/23 (!) 330 lb (149.7 kg)  04/09/23 (!) 328 lb (148.8 kg)    Physical Exam  Constitutional: Patient appears well-developed and well-nourished. Obese  No distress.  HEENT: head atraumatic, normocephalic, pupils equal and reactive to light, neck supple, throat within normal limits Cardiovascular: Normal rate, regular rhythm and normal heart sounds.  No murmur heard. No BLE edema. Pulmonary/Chest: Effort normal and breath sounds normal. No respiratory distress. Abdominal: Soft.  There is no tenderness. Psychiatric: Patient has a normal mood and affect. behavior is normal. Judgment and thought content normal.  Results for orders placed or performed during the hospital encounter of 05/05/23  Lipase, blood  Result Value Ref Range   Lipase 32 11 - 51 U/L  Comprehensive metabolic panel  Result Value Ref Range   Sodium 138 135 - 145 mmol/L   Potassium 3.9 3.5 - 5.1 mmol/L   Chloride 100 98 - 111 mmol/L   CO2 28 22 -  32 mmol/L   Glucose, Bld 89 70 - 99 mg/dL   BUN 12 6 - 20 mg/dL   Creatinine, Ser 1.61 0.61 - 1.24 mg/dL   Calcium 9.3 8.9 - 09.6 mg/dL   Total Protein 7.9 6.5 - 8.1 g/dL   Albumin 4.0 3.5 - 5.0 g/dL   AST 24 15 - 41 U/L   ALT 32 0 - 44 U/L   Alkaline Phosphatase 82 38 - 126 U/L   Total Bilirubin 0.9 0.3 - 1.2 mg/dL   GFR, Estimated >04 >54 mL/min   Anion gap 10 5 - 15  CBC with Differential  Result Value Ref Range   WBC 8.5 4.0 - 10.5 K/uL   RBC 5.27 4.22 - 5.81 MIL/uL   Hemoglobin 14.3 13.0 - 17.0 g/dL   HCT 09.8 11.9 - 14.7 %   MCV 83.7 80.0 -  100.0 fL   MCH 27.1 26.0 - 34.0 pg   MCHC 32.4 30.0 - 36.0 g/dL   RDW 82.9 56.2 - 13.0 %   Platelets 287 150 - 400 K/uL   nRBC 0.0 0.0 - 0.2 %   Neutrophils Relative % 59 %   Neutro Abs 5.1 1.7 - 7.7 K/uL   Lymphocytes Relative 29 %   Lymphs Abs 2.4 0.7 - 4.0 K/uL   Monocytes Relative 7 %   Monocytes Absolute 0.6 0.1 - 1.0 K/uL   Eosinophils Relative 3 %   Eosinophils Absolute 0.3 0.0 - 0.5 K/uL   Basophils Relative 1 %   Basophils Absolute 0.1 0.0 - 0.1 K/uL   Immature Granulocytes 1 %   Abs Immature Granulocytes 0.04 0.00 - 0.07 K/uL  Troponin I (High Sensitivity)  Result Value Ref Range   Troponin I (High Sensitivity) <2 <18 ng/L  Troponin I (High Sensitivity)  Result Value Ref Range   Troponin I (High Sensitivity) <2 <18 ng/L      Assessment & Plan:   Problem List Items Addressed This Visit       Digestive   Gastroesophageal reflux disease without esophagitis - Primary    Increase omeprazole to 40 mg daily, continue pepcid 20 mg BID, referral placed to gi      Relevant Medications   omeprazole (PRILOSEC) 40 MG capsule   Other Relevant Orders   Ambulatory referral to Gastroenterology   Other Visit Diagnoses     Abnormal EKG       referral placed to cardiology   Relevant Orders   Ambulatory referral to Cardiology        Follow up plan: Return in about 6 months (around 11/07/2023) for follow up.

## 2023-05-10 NOTE — Assessment & Plan Note (Signed)
Increase omeprazole to 40 mg daily, continue pepcid 20 mg BID, referral placed to gi

## 2023-08-09 ENCOUNTER — Emergency Department
Admission: EM | Admit: 2023-08-09 | Discharge: 2023-08-09 | Disposition: A | Discharge status: Home / Self Care | Payer: Medicaid Other | Attending: Emergency Medicine | Admitting: Emergency Medicine

## 2023-08-09 ENCOUNTER — Other Ambulatory Visit: Payer: Self-pay

## 2023-08-09 DIAGNOSIS — K0381 Cracked tooth: Secondary | ICD-10-CM | POA: Insufficient documentation

## 2023-08-09 DIAGNOSIS — R220 Localized swelling, mass and lump, head: Secondary | ICD-10-CM | POA: Diagnosis present

## 2023-08-09 DIAGNOSIS — K029 Dental caries, unspecified: Secondary | ICD-10-CM | POA: Diagnosis not present

## 2023-08-09 MED ORDER — AMOXICILLIN-POT CLAVULANATE 875-125 MG PO TABS: 1.0000 | ORAL_TABLET | Freq: Two times a day (BID) | ORAL | 0 refills | Status: AC

## 2023-08-09 MED ORDER — AMOXICILLIN-POT CLAVULANATE 875-125 MG PO TABS
1.0000 | ORAL_TABLET | Freq: Once | ORAL | Status: AC
  Administered 2023-08-09: 1 via ORAL
  Filled 2023-08-09: qty 1

## 2023-08-09 NOTE — ED Notes (Signed)
See triage note   presents with dental pain  States he had a tooth break off abut 3 weeks ago   No having some swelling to face  Afebrile on arrival

## 2023-08-09 NOTE — Discharge Instructions (Signed)
OPTIONS FOR DENTAL FOLLOW UP CARE ° °[place] [facility] and Human Services - Local Safety Net Dental Clinics °http://www.ncdhhs.gov/dph/oralhealth/services/safetynetclinics.htm °  °[facility] (336-562-3123) ° °[facility] (919-933-9087) ° °[facility] ([phone] ext [phone]) ° °[facility] ([phone]) ° °[facility] ([phone]) °This clinic caters to the indigent population and is on a lottery system. °Location: °[facility], [address], [place] °Clinic Hours: °Wednesdays from 6pm - 9pm, patients seen by a lottery system. °For dates, call or go to www.med.[facility].edu/[facility]/patients/Dental-[facility] °Services: °Cleanings, fillings and simple extractions. °Payment Options: °DENTAL WORK IS FREE OF CHARGE. Bring proof of income or support. °Best way to get seen: °Arrive at 5:15 pm - this is a lottery, NOT first come/first serve, so arriving earlier will not increase your chances of being seen. °  °  °[facility] °[phone] °Select option 1 for emergencies °  °Location: °[facility], [address], [place] °Clinic Hours: °No walk-ins accepted - call the day before to schedule an appointment. °Check in times are 9:30 am and 1:30 pm. °Services: °Simple extractions, temporary fillings, pulpectomy/pulp debridement, uncomplicated abscess drainage. °Payment Options: °PAYMENT IS DUE AT THE TIME OF SERVICE.  Fee is usually $100-200, additional surgical procedures (e.g. abscess drainage) may be extra. °Cash, checks, Visa/MasterCard accepted.  Can file Medicaid if patient is covered for dental - patient should call case worker to check. °No discount for [facility] patients. °Best way to get seen: °MUST call the day before and get onto the schedule. Can usually be seen the next 1-2 days. No walk-ins accepted. °  °  °[facility] °[phone] °   °Location: °[facility], [address], [place] °Clinic Hours: °M, W, Th, F 8am or 1:30pm, Tues 9a or 1:30 - first come/first served. °Services: °Simple extractions, temporary fillings, uncomplicated abscess drainage.  You do not need to be an [place] resident. °Payment Options: °PAYMENT IS DUE AT THE TIME OF SERVICE. °Dental insurance, otherwise sliding scale - bring proof of income or support. °Depending on income and treatment needed, cost is usually $50-200. °Best way to get seen: °Arrive early as it is first come/first served. °  °  °[facility] °[phone] °  °Location: °[address] °Clinic Hours: °Mon-Thu 8a-5p °Services: °Most basic dental services including extractions and fillings. °Payment Options: °PAYMENT IS DUE AT THE TIME OF SERVICE. °Sliding scale, up to 50% off - bring proof if income or support. °Medicaid with dental option accepted. °Best way to get seen: °Call to schedule an appointment, can usually be seen within 2 weeks OR they will try to see walk-ins - show up at 8a or 2p (you may have to wait). °  °  °[facility] °919-245-2435 °[place] RESIDENTS ONLY °  °Location: °[facility], [address], [place], [place] [address] °Clinic Hours: By appointment only. °Monday - Thursday 8am-5pm, Friday 8am-12pm °Services: Cleanings, fillings, extractions. °Payment Options: °PAYMENT IS DUE AT THE TIME OF SERVICE. °Cash, Visa or MasterCard. Sliding scale - $30 minimum per service. °Best way to get seen: °Come in to office, complete packet and make an appointment - need proof of income °or support monies for each household member and proof of [place] residence. °Usually takes about a month to get in. °  °  °[facility] °[phone] °  °Location: °[address]   [place] °Clinic Hours: Walk-in Urgent Care Dental Services are offered Monday-Friday  mornings only. °The numbers of emergencies accepted daily is limited to the number of °providers available. °Maximum 15 - Mondays, Wednesdays & Thursdays °Maximum 10 - Tuesdays & Fridays °Services: °You do not need to be a [place] resident to be seen for a dental emergency. °Emergencies are defined as pain, swelling, abnormal bleeding, or dental trauma. Walkins will receive x-rays if needed. °NOTE: Dental cleaning is not an emergency. °Payment Options: °PAYMENT IS DUE AT THE TIME OF SERVICE. °Minimum co-pay is $40.00 for uninsured patients. °Minimum co-pay is $3.00 for Medicaid with dental coverage. °Dental Insurance is accepted and must be presented at time of visit. °Medicare does not cover dental. °Forms of payment: Cash, credit card, checks. °Best way to get seen: °If not previously registered with the clinic, walk-in dental registration begins at 7:15 am and is on a first come/first serve basis. °If previously registered with the clinic, call to make an appointment. °  °  °[facility] °[phone] °[place] RESIDENTS ONLY °  °Location: °[address], [place], [place] °Clinic Hours: °Mon-Thu 10a-2p °Services: Extractions only! °Payment Options: °FREE (donations accepted) - bring proof of income or support °Best way to get seen: °Call and schedule an appointment OR come at 8am on the 1st Monday of every month (except for holidays) when it is first come/first served. °  °  °[facility] °[phone] °  °Location: °[address], [place] °Clinic Hours: °Friday mornings °Services, Payment Options, Best way to get seen: °Call for info °

## 2023-08-09 NOTE — ED Provider Triage Note (Signed)
Emergency Medicine Provider Triage Evaluation Note  Gerald Davis , a 22 y.o. male  was evaluated in triage.  Pt complains of facial swelling for the past week after tooth broke 3 weeks ago. No fever.  Physical Exam  BP (!) 140/72   Pulse 68   Temp 97.8 F (36.6 C)   Resp 16   SpO2 94%  Gen:   Awake, no distress   Resp:  Normal effort  MSK:   Moves extremities without difficulty  Other:  Right side facial swelling. Airway patent  Medical Decision Making  Medically screening exam initiated at 4:47 PM.  Appropriate orders placed.  Gerald Davis was informed that the remainder of the evaluation will be completed by another provider, this initial triage assessment does not replace that evaluation, and the importance of remaining in the ED until their evaluation is complete.     Chinita Pester, FNP 08/09/23 [phone]

## 2023-08-09 NOTE — ED Provider Notes (Signed)
[facility] Emergency Department Provider Note     Event Date/Time   First MD Initiated Contact with Patient 08/09/23 1822     (approximate)   History   Dental Problem   HPI  Gerald Davis is a 22 y.o. male with a history of PTSD, anxiety, bipolar disorder, and GERD, who presents to the ED for evaluation of swelling to the right side of his face.  Patient will report to chronically broken teeth to the right upper mandible.  Over the last 2 to 3 days, he began develop cycle into the right side of his cheek.  He denies difficulty breathing, swallowing, controlling oral secretions.  Patient would endorse that he has a dental provider appointment scheduled for 2 weeks.  No fevers, chills, or sweats reported.   Physical Exam   Triage Vital Signs: ED Triage Vitals  Encounter Vitals Group     BP 08/09/23 1646 (!) 140/72     Systolic BP Percentile --      Diastolic BP Percentile --      Pulse Rate 08/09/23 1646 68     Resp 08/09/23 1646 16     Temp 08/09/23 1644 97.8 F (36.6 C)     Temp Source 08/09/23 1819 Oral     SpO2 08/09/23 1646 94 %     Weight 08/09/23 1646 (!) 330 lb (149.7 kg)     Height 08/09/23 1646 6\' 2"  (1.88 m)     Head Circumference --      Peak Flow --      Pain Score 08/09/23 1646 6     Pain Loc --      Pain Education --      Exclude from Growth Chart --     Most recent vital signs: Vitals:   08/09/23 1819 08/09/23 1916  BP:  138/74  Pulse:  67  Resp:  16  Temp: 97.6 F (36.4 C) 97.8 F (36.6 C)  SpO2:  95%    General Awake, no distress. NAD HEENT NCAT. PERRL. EOMI. No rhinorrhea. Mucous membranes are moist.  Uvula is midline and tonsils are flat.  Subtle swelling noted to the right malar cheek.  On exam patient has 2 chronically broken upper molars from the first and second position.  Some local gum edema is appreciated.  No brawny swelling or edema is noted. CV:  Good peripheral perfusion.  RESP:  Normal effort.   ABD:  No distention.    ED Results / Procedures / Treatments   Labs (all labs ordered are listed, but only abnormal results are displayed) Labs Reviewed - No data to display   EKG   RADIOLOGY No results found.   PROCEDURES:  Critical Care performed: No  Procedures   MEDICATIONS ORDERED IN ED: Medications  amoxicillin-clavulanate (AUGMENTIN) 875-125 MG per tablet 1 tablet (1 tablet Oral Given 08/09/23 1912)     IMPRESSION / MDM / ASSESSMENT AND PLAN / ED COURSE  I reviewed the triage vital signs and the nursing notes.                              Differential diagnosis includes, but is not limited to, dental fracture, dental infection, dental caries, dental gingivitis  Patient's presentation is most consistent with acute, uncomplicated illness.  Patient's diagnosis is consistent with dental caries and dental infection secondary to chronically broken teeth.  Patient with reassuring exam and workup at this  time.  Some subtle swelling consistent with a local dental abscess.  No evidence of airway compromise or angioedema.  Patient will be discharged home with prescriptions for Augmentin. Patient is to follow up with his selected dental provider as scheduled, as needed or otherwise directed. Patient is given ED precautions to return to the ED for any worsening or new symptoms.  FINAL CLINICAL IMPRESSION(S) / ED DIAGNOSES   Final diagnoses:  Broken or cracked tooth, nontraumatic  Dental caries     Rx / DC Orders   ED Discharge Orders          Ordered    amoxicillin-clavulanate (AUGMENTIN) 875-125 MG tablet  2 times daily        08/09/23 1847             Note:  This document was prepared using Dragon voice recognition software and may include unintentional dictation errors.    Lissa Hoard, PA-C 08/09/23 Deatra Robinson, MD 08/09/23 2203

## 2023-08-09 NOTE — ED Triage Notes (Signed)
Pt reports right sided facial swelling for 1 week. Reports tooth broke off to left lower 3 weeks ago.  No swelling noted to lips or tongue.

## 2023-10-17 ENCOUNTER — Ambulatory Visit
Admission: RE | Admit: 2023-10-17 | Discharge: 2023-10-17 | Disposition: A | Discharge status: Home / Self Care | Source: Ambulatory Visit | Attending: Nurse Practitioner | Admitting: Nurse Practitioner

## 2023-10-17 ENCOUNTER — Ambulatory Visit: Admitting: Nurse Practitioner

## 2023-10-17 ENCOUNTER — Encounter: Payer: Self-pay | Admitting: Nurse Practitioner

## 2023-10-17 ENCOUNTER — Ambulatory Visit
Admission: RE | Admit: 2023-10-17 | Discharge: 2023-10-17 | Disposition: A | Discharge status: Home / Self Care | Source: Ambulatory Visit | Attending: Nurse Practitioner | Admitting: *Deleted

## 2023-10-17 ENCOUNTER — Ambulatory Visit: Attending: Nurse Practitioner

## 2023-10-17 VITALS — BP 132/80 | HR 95 | Temp 97.7°F | Resp 18 | Ht 74.0 in | Wt 334.1 lb

## 2023-10-17 DIAGNOSIS — R002 Palpitations: Secondary | ICD-10-CM

## 2023-10-17 DIAGNOSIS — Z8249 Family history of ischemic heart disease and other diseases of the circulatory system: Secondary | ICD-10-CM

## 2023-10-17 DIAGNOSIS — R0602 Shortness of breath: Secondary | ICD-10-CM

## 2023-10-17 DIAGNOSIS — R079 Chest pain, unspecified: Secondary | ICD-10-CM | POA: Insufficient documentation

## 2023-10-17 NOTE — Progress Notes (Signed)
 BP 132/80   Pulse 95   Temp 97.7 F (36.5 C)   Resp 18   Ht 6\' 2"  (1.88 m)   Wt (!) 334 lb 1.6 oz (151.5 kg)   SpO2 95%   BMI 42.90 kg/m    Subjective:    Patient ID: Gerald Davis, male    DOB: 07/25/2001, 23 y.o.   MRN: [id number]  HPI: Gerald Davis is a 23 y.o. male  Chief Complaint  Patient presents with   Chest Pain    Continued never saw cardio, states never received call or letter? Needs new referral placed    Discussed the use of AI scribe software for clinical note transcription with the patient, who gave verbal consent to proceed.  History of Present Illness   Gerald Davis is a 23 year old male who presents with ongoing chest pain.  He has been experiencing chest pain since September 2024. The pain is described as 'poking' and sharp, with radiation across the chest and a sensation of heat. It occurs intermittently and is not influenced by activity, as it can occur while sitting. He was previously seen in the emergency department on May 05, 2023, where an EKG showed sinus bradycardia. He followed up on May 10, 2023, and was referred to cardiology, but he did not receive any communication from them. He also experiences occasional palpitations and shortness of breath.  He was referred to gastroenterology for worsening GERD, but his acid reflux symptoms have improved, and he has not experienced any recent episodes.  There is a significant family history of cardiac issues, including a heart murmur in his father, heart problems and high blood pressure in his grandmother, and a heart attack in his grandfather.       05/10/2023    2:11 PM 04/09/2023    1:39 PM  Depression screen PHQ 2/9  Decreased Interest 0 0  Down, Depressed, Hopeless 1 0  PHQ - 2 Score 1 0  Altered sleeping 1 3  Tired, decreased energy 1 2  Change in appetite 0 0  Feeling bad or failure about yourself  0 0  Trouble concentrating 0 2  Moving slowly or fidgety/restless 1 0   Suicidal thoughts 0 0  PHQ-9 Score 4 7  Difficult doing work/chores Somewhat difficult Somewhat difficult    Relevant past medical, surgical, family and social history reviewed and updated as indicated. Interim medical history since our last visit reviewed. Allergies and medications reviewed and updated.  Review of Systems  Ten systems reviewed and is negative except as mentioned in HPI      Objective:    BP 132/80   Pulse 95   Temp 97.7 F (36.5 C)   Resp 18   Ht 6\' 2"  (1.88 m)   Wt (!) 334 lb 1.6 oz (151.5 kg)   SpO2 95%   BMI 42.90 kg/m    Wt Readings from Last 3 Encounters:  10/17/23 (!) 334 lb 1.6 oz (151.5 kg)  08/09/23 (!) 330 lb (149.7 kg)  05/10/23 (!) 328 lb (148.8 kg)    Physical Exam Vitals reviewed.  Constitutional:      Appearance: Normal appearance.  HENT:     Head: Normocephalic.  Cardiovascular:     Rate and Rhythm: Normal rate and regular rhythm.  Pulmonary:     Effort: Pulmonary effort is normal.     Breath sounds: Normal breath sounds.  Musculoskeletal:        General: Normal range of  motion.  Skin:    General: Skin is warm and dry.  Neurological:     General: No focal deficit present.     Mental Status: He is alert and oriented to person, place, and time. Mental status is at baseline.  Psychiatric:        Mood and Affect: Mood normal.        Behavior: Behavior normal.        Thought Content: Thought content normal.        Judgment: Judgment normal.    EKG: unchanged from previous tracings, sinus bradycardia.  Results for orders placed or performed during the hospital encounter of 05/05/23  Lipase, blood   Collection Time: 05/05/23  1:47 AM  Result Value Ref Range   Lipase 32 11 - 51 U/L  Comprehensive metabolic panel   Collection Time: 05/05/23  1:47 AM  Result Value Ref Range   Sodium 138 135 - 145 mmol/L   Potassium 3.9 3.5 - 5.1 mmol/L   Chloride 100 98 - 111 mmol/L   CO2 28 22 - 32 mmol/L   Glucose, Bld 89 70 - 99 mg/dL    BUN 12 6 - 20 mg/dL   Creatinine, Ser 8.29 0.61 - 1.24 mg/dL   Calcium 9.3 8.9 - 56.2 mg/dL   Total Protein 7.9 6.5 - 8.1 g/dL   Albumin 4.0 3.5 - 5.0 g/dL   AST 24 15 - 41 U/L   ALT 32 0 - 44 U/L   Alkaline Phosphatase 82 38 - 126 U/L   Total Bilirubin 0.9 0.3 - 1.2 mg/dL   GFR, Estimated >13 >08 mL/min   Anion gap 10 5 - 15  CBC with Differential   Collection Time: 05/05/23  1:47 AM  Result Value Ref Range   WBC 8.5 4.0 - 10.5 K/uL   RBC 5.27 4.22 - 5.81 MIL/uL   Hemoglobin 14.3 13.0 - 17.0 g/dL   HCT 65.7 84.6 - 96.2 %   MCV 83.7 80.0 - 100.0 fL   MCH 27.1 26.0 - 34.0 pg   MCHC 32.4 30.0 - 36.0 g/dL   RDW 95.2 84.1 - 32.4 %   Platelets 287 150 - 400 K/uL   nRBC 0.0 0.0 - 0.2 %   Neutrophils Relative % 59 %   Neutro Abs 5.1 1.7 - 7.7 K/uL   Lymphocytes Relative 29 %   Lymphs Abs 2.4 0.7 - 4.0 K/uL   Monocytes Relative 7 %   Monocytes Absolute 0.6 0.1 - 1.0 K/uL   Eosinophils Relative 3 %   Eosinophils Absolute 0.3 0.0 - 0.5 K/uL   Basophils Relative 1 %   Basophils Absolute 0.1 0.0 - 0.1 K/uL   Immature Granulocytes 1 %   Abs Immature Granulocytes 0.04 0.00 - 0.07 K/uL  Troponin I (High Sensitivity)   Collection Time: 05/05/23  1:47 AM  Result Value Ref Range   Troponin I (High Sensitivity) <2 <18 ng/L  Troponin I (High Sensitivity)   Collection Time: 05/05/23  3:42 AM  Result Value Ref Range   Troponin I (High Sensitivity) <2 <18 ng/L       Assessment & Plan:   Problem List Items Addressed This Visit   None Visit Diagnoses       Chest pain, unspecified type    -  Primary   Relevant Orders   Ambulatory referral to Cardiology   EKG 12-Lead   DG Chest 2 View   LONG TERM MONITOR (3-14 DAYS)   CBC with Differential/Platelet  COMPLETE METABOLIC PANEL WITH GFR   TSH     Family history of cardiac disorder       Relevant Orders   Ambulatory referral to Cardiology   EKG 12-Lead   DG Chest 2 View     Palpitations       Relevant Orders   EKG 12-Lead   DG  Chest 2 View   LONG TERM MONITOR (3-14 DAYS)   CBC with Differential/Platelet   COMPLETE METABOLIC PANEL WITH GFR   TSH     Shortness of breath       Relevant Orders   EKG 12-Lead   DG Chest 2 View   LONG TERM MONITOR (3-14 DAYS)   CBC with Differential/Platelet   COMPLETE METABOLIC PANEL WITH GFR   TSH        Assessment and Plan    Intermittent Chest Pain Intermittent chest pain with family history of cardiac issues. Sinus bradycardia on EKG. Differential includes cardiac and non-cardiac causes. Shortness of breath and palpitations noted. Further diagnostics needed. - Order repeat EKG and chest X-ray. - Order lab work. - Arrange for heart monitor delivery for two-week use. - Place referral to cardiology and provide contact information.  Gastroesophageal Reflux Disease (GERD) GERD symptoms improved. Previous gastroenterology referral with no response from patient.        Follow up plan: Return if symptoms worsen or fail to improve.

## 2023-10-18 ENCOUNTER — Encounter: Payer: Self-pay | Admitting: Nurse Practitioner

## 2023-10-18 LAB — COMPLETE METABOLIC PANEL WITH GFR
AG Ratio: 1.4 (calc) (ref 1.0–2.5)
ALT: 29 U/L (ref 9–46)
AST: 19 U/L (ref 10–40)
Albumin: 4.3 g/dL (ref 3.6–5.1)
Alkaline phosphatase (APISO): 102 U/L (ref 36–130)
BUN: 13 mg/dL (ref 7–25)
CO2: 25 mmol/L (ref 20–32)
Calcium: 9.9 mg/dL (ref 8.6–10.3)
Chloride: 101 mmol/L (ref 98–110)
Creat: 1.06 mg/dL (ref 0.60–1.24)
Globulin: 3.1 g/dL (ref 1.9–3.7)
Glucose, Bld: 88 mg/dL (ref 65–99)
Potassium: 4.5 mmol/L (ref 3.5–5.3)
Sodium: 138 mmol/L (ref 135–146)
Total Bilirubin: 0.6 mg/dL (ref 0.2–1.2)
Total Protein: 7.4 g/dL (ref 6.1–8.1)
eGFR: 102 mL/min/{1.73_m2} (ref >=60)

## 2023-10-18 LAB — CBC WITH DIFFERENTIAL/PLATELET
Absolute Lymphocytes: 2338 {cells}/uL (ref 850–3900)
Absolute Monocytes: 482 {cells}/uL (ref 200–950)
Basophils Absolute: 63 {cells}/uL (ref 0–200)
Basophils Relative: 0.8 %
Eosinophils Absolute: 158 {cells}/uL (ref 15–500)
Eosinophils Relative: 2 %
HCT: 46.7 % (ref 38.5–50.0)
Hemoglobin: 15.5 g/dL (ref 13.2–17.1)
MCH: 26.7 pg — ABNORMAL LOW (ref 27.0–33.0)
MCHC: 33.2 g/dL (ref 32.0–36.0)
MCV: 80.5 fL (ref 80.0–100.0)
MPV: 11 fL (ref 7.5–12.5)
Monocytes Relative: 6.1 %
Neutro Abs: 4859 {cells}/uL (ref 1500–7800)
Neutrophils Relative %: 61.5 %
Platelets: 289 10*3/uL (ref 140–400)
RBC: 5.8 10*6/uL (ref 4.20–5.80)
RDW: 13 % (ref 11.0–15.0)
Total Lymphocyte: 29.6 %
WBC: 7.9 10*3/uL (ref 3.8–10.8)

## 2023-10-18 LAB — TSH: TSH: 1.42 m[IU]/L (ref 0.40–4.50)

## 2023-10-19 DIAGNOSIS — R079 Chest pain, unspecified: Secondary | ICD-10-CM

## 2023-10-19 DIAGNOSIS — R002 Palpitations: Secondary | ICD-10-CM

## 2023-10-19 DIAGNOSIS — R0602 Shortness of breath: Secondary | ICD-10-CM | POA: Diagnosis not present

## 2023-10-25 ENCOUNTER — Telehealth: Payer: Self-pay

## 2023-10-25 NOTE — Telephone Encounter (Signed)
 Called mother and made aware. Mother verbalized understanding

## 2023-10-25 NOTE — Telephone Encounter (Signed)
 Copied from [id number]. Topic: Clinical - Prescription Issue >> Oct 25, 2023 12:11 PM Dondra Prader E wrote: Reason for CRM: Pt's mother called to report that the patient is experiencing skin irritation at the site of his heart monitor.

## 2023-10-29 ENCOUNTER — Encounter: Payer: Self-pay | Admitting: Cardiology

## 2023-10-29 ENCOUNTER — Ambulatory Visit: Attending: Cardiology | Admitting: Cardiology

## 2023-10-29 VITALS — BP 120/76 | HR 59 | Ht 74.0 in | Wt 335.0 lb

## 2023-10-29 DIAGNOSIS — I498 Other specified cardiac arrhythmias: Secondary | ICD-10-CM | POA: Diagnosis not present

## 2023-10-29 DIAGNOSIS — R079 Chest pain, unspecified: Secondary | ICD-10-CM

## 2023-10-29 DIAGNOSIS — E66813 Obesity, class 3: Secondary | ICD-10-CM

## 2023-10-29 DIAGNOSIS — E782 Mixed hyperlipidemia: Secondary | ICD-10-CM

## 2023-10-29 DIAGNOSIS — R002 Palpitations: Secondary | ICD-10-CM | POA: Insufficient documentation

## 2023-10-29 DIAGNOSIS — Z8249 Family history of ischemic heart disease and other diseases of the circulatory system: Secondary | ICD-10-CM

## 2023-10-29 DIAGNOSIS — Z6841 Body Mass Index (BMI) 40.0 and over, adult: Secondary | ICD-10-CM

## 2023-10-29 NOTE — Progress Notes (Unsigned)
 Cardiology Office Note:  .   Date:  10/30/2023  ID:  Gerald Davis, DOB Jul 26, 2001, MRN [id number] PCP: Berniece Salines, FNP  [facility] Providers Cardiologist:  Bryan Lemma, MD     Chief Complaint  Patient presents with   New Patient (Initial Visit)    Evaluation of chest pain and palpitations.  Monitor completed but not yet mailed in.    Patient Profile: .     Gerald Davis is a morbidly obese 23 y.o. male with a PMH notable for GERD who presents here for evaluation of "chest pain, palpitations and shortness of breath "at the request of Berniece Salines, FNP.  He was wearing a monitor that fell off.  Results not yet available to read at the time of his visit.    Gerald Davis was last seen on October 17, 2023 for complaints of poking/sharp chest pain severity across the chest since September 2024.  Apparently was referred to cardiology in September 2024 occasionally notes some palpitations and shortness of breath. Clinical decision making: Family history of cardiac disorder with palpitations shortness of breath and chest pain-referred to cardiology with event monitor, TSH and chemistry panel ordered.  Cardiology appointment scheduled before the monitor was completed.  Subjective  Discussed the use of AI scribe software for clinical note transcription with the patient, who gave verbal consent to proceed.  History of Present Illness   Gerald Davis is a 23 year old male who presents with heart problems and palpitations.  He experiences palpitations characterized by forceful and sometimes rapid heartbeats lasting about 30 seconds, occasionally causing lightheadedness but not syncope. These episodes are exacerbated by physical activity, such as using an elliptical machine, where his heart rate can reach 200.5 bpm, leading to discomfort and 'weird sharp pains.'  His heart was initially described as 'pumping too weak,' prompting the use of a heart monitor, which was intended to  be worn for two weeks but fell off after one week. Some data was collected, though results are pending review. No leg swelling, orthopnea, paroxysmal nocturnal dyspnea, or syncope. Lightheadedness occurs when his heart rate increases. Symptoms occur randomly and are not consistently triggered by specific activities.  Family history is significant for heart disease. His paternal grandfather had diabetes, underwent amputations, and died of a massive heart attack. His maternal grandfather died of cancer but also had a heart attack. On his wife's side, there is a history of heart attacks in her grandmother and aunt. He and his wife undergo yearly cardiology evaluations due to this family history.     Cardiovascular ROS: positive for - chest pain, irregular heartbeat, palpitations, shortness of breath, and lightheadedness and dizziness; exertional dyspnea related to deconditioning; rapid heart rate with exertion negative for - edema, orthopnea, paroxysmal nocturnal dyspnea, shortness of breath, or syncope or near syncope, TIA or emesis fugax, claudication  ROS:  Review of Systems - Negative except symptoms noted in HPI.    Objective   Family History - Father had diabetes -Paternal grandfather had heart attack and died -Maternal grandfather father died of cancer, but did have a heart attack - Heart attack in maternal grandmother - Heart attack in maternal aunt  Studies Reviewed: Marland Kitchen   EKG Interpretation Date/Time:  Monday October 29 2023 08:23:23 EDT Ventricular Rate:  59 PR Interval:  166 QRS Duration:  86 QT Interval:  364 QTC Calculation: 360 R Axis:   64  Text Interpretation: Sinus bradycardia with sinus arrhythmia When compared  with ECG of 05-May-2023 01:11, No significant change was found Confirmed by Bryan Lemma ([id number]) on 10/29/2023 8:33:32 AM   Monitor pending EKG from 525 (PCPs office): Sinus bradycardia at 50 bpm with sinus arrhythmia.  Otherwise normal.  Risk  Assessment/Calculations:             Physical Exam:   VS:  BP 120/76   Pulse (!) 59   Ht 6\' 2"  (1.88 m)   Wt (!) 335 lb (152 kg)   SpO2 97%   BMI 43.01 kg/m    Wt Readings from Last 3 Encounters:  10/29/23 (!) 335 lb (152 kg)  10/17/23 (!) 334 lb 1.6 oz (151.5 kg)  08/09/23 (!) 330 lb (149.7 kg)    GEN: Well nourished, well developed in no acute distress; morbidly obese NECK: No JVD; No carotid bruits CARDIAC: Distant S1, S2; RRR, no murmurs, rubs, gallops RESPIRATORY:  Clear to auscultation without rales, wheezing or rhonchi ; nonlabored, good air movement. ABDOMEN: Soft, non-tender, non-distended EXTREMITIES:  No edema; No deformity     ASSESSMENT AND PLAN: .    Problem List Items Addressed This Visit       Cardiology Problems   Hyperlipidemia     Other   Chest pain   Atypical chest discomfort symptoms that are more focal pinpoint symptoms that are more consistent with the palpitations.  Would like to see the results of the monitor.  We can then reassess and determine if there is any need for stress test.  I would like to potentially demonstrate the exertional tachycardia, but the fact that it gives heart rate 61 beats a minute not have chest pain would not indicate no obstructive CAD.      Relevant Orders   EKG 12-Lead (Completed)   Class 3 severe obesity due to excess calories with serious comorbidity and body mass index (BMI) of 40.0 to 44.9 in adult [facility])   Discussed lifestyle medication, but may want to consider GLP-1 agonist such as Wegovy or Zepbound.      Family history of heart disease (Chronic)   Palpitations - Primary   Episodes of palpitations with heart rates up to 200 bpm during exercise but mostly symptoms are consistent with fleeting episodes that are most likely due to premature beats.  Dehydration and deconditioning may contribute to the exertional tachycardia. - Review heart monitor results => will need the results forwarded from PCPs office -  Consider echocardiogram if significant abnormalities. - Advise adequate hydration before and during exercise. - Encourage continued exercise with proper hydration.      Sinus arrhythmia seen on electrocardiogram   Previously diagnosed sinus arrhythmia, benign in young, heavier individuals.                Follow-Up: Return if symptoms worsen or fail to improve, for Discussion of other tests, pending results of monitor.Leonides Schanz, Marykay Lex, MD, MS Bryan Lemma, M.D., M.S. Interventional Cardiologist  [facility]  Pager # [phone] Phone # [phone] [address] [place], [place] [address]

## 2023-10-29 NOTE — Patient Instructions (Signed)
 Medication Instructions:   No changes    Lab Work:  Not needed   Testing/Procedures:  Not needed  Follow-Up: At [facility], you and your health needs are our priority.  As part of our continuing mission to provide you with exceptional heart care, we have created designated Provider Care Teams.  These Care Teams include your primary Cardiologist (physician) and Advanced Practice Providers (APPs -  Physician Assistants and Nurse Practitioners) who all work together to provide you with the care you need, when you need it.     Your next appointment:   As needed   The format for your next appointment:   In Person  Provider:   Bryan Lemma, MD    Other Instructions   Depending on recent monitor results if another appointment is needed -- will inform you once received monitor report

## 2023-10-30 ENCOUNTER — Encounter: Payer: Self-pay | Admitting: Cardiology

## 2023-10-30 DIAGNOSIS — I498 Other specified cardiac arrhythmias: Secondary | ICD-10-CM | POA: Insufficient documentation

## 2023-10-30 DIAGNOSIS — R079 Chest pain, unspecified: Secondary | ICD-10-CM | POA: Insufficient documentation

## 2023-10-30 DIAGNOSIS — Z8249 Family history of ischemic heart disease and other diseases of the circulatory system: Secondary | ICD-10-CM | POA: Insufficient documentation

## 2023-10-30 NOTE — Assessment & Plan Note (Signed)
 Atypical chest discomfort symptoms that are more focal pinpoint symptoms that are more consistent with the palpitations.  Would like to see the results of the monitor.  We can then reassess and determine if there is any need for stress test.  I would like to potentially demonstrate the exertional tachycardia, but the fact that it gives heart rate 61 beats a minute not have chest pain would not indicate no obstructive CAD.

## 2023-10-30 NOTE — Assessment & Plan Note (Signed)
 Episodes of palpitations with heart rates up to 200 bpm during exercise but mostly symptoms are consistent with fleeting episodes that are most likely due to premature beats.  Dehydration and deconditioning may contribute to the exertional tachycardia. - Review heart monitor results => will need the results forwarded from PCPs office - Consider echocardiogram if significant abnormalities. - Advise adequate hydration before and during exercise. - Encourage continued exercise with proper hydration.

## 2023-10-30 NOTE — Assessment & Plan Note (Addendum)
 Discussed lifestyle medication, but may want to consider GLP-1 agonist such as Wegovy or Zepbound.

## 2023-10-30 NOTE — Assessment & Plan Note (Signed)
 Previously diagnosed sinus arrhythmia, benign in young, heavier individuals.

## 2023-11-08 ENCOUNTER — Ambulatory Visit: Payer: Medicaid Other | Admitting: Nurse Practitioner

## 2023-11-15 ENCOUNTER — Encounter: Payer: Self-pay | Admitting: Nurse Practitioner

## 2023-11-21 ENCOUNTER — Encounter: Payer: Self-pay | Admitting: Nurse Practitioner

## 2023-11-21 ENCOUNTER — Ambulatory Visit: Admitting: Nurse Practitioner

## 2023-11-21 VITALS — BP 128/84 | HR 86 | Resp 18 | Ht 74.0 in | Wt 331.4 lb

## 2023-11-21 DIAGNOSIS — R002 Palpitations: Secondary | ICD-10-CM | POA: Diagnosis not present

## 2023-11-21 NOTE — Progress Notes (Signed)
 BP 128/84   Pulse 86   Resp 18   Ht 6\' 2"  (1.88 m)   Wt (!) 331 lb 6.4 oz (150.3 kg)   SpO2 94%   BMI 42.55 kg/m    Subjective:    Patient ID: Gerald Davis, male    DOB: 10/10/2000, 23 y.o.   MRN: [id number]  HPI: Gerald Davis is a 23 y.o. male  Chief Complaint  Patient presents with   Medical Management of Chronic Issues    Discussed the use of AI scribe software for clinical note transcription with the patient, who gave verbal consent to proceed.  History of Present Illness Gerald Davis is a 23 year old male who presents with chest pain, palpitations, and shortness of breath. He was referred to cardiology for further evaluation.  He was initially evaluated on October 17, 2023, where he underwent a chest x-ray and blood work including TSH, CMP, and CBC, all of which were normal. A Zio patch was performed, showing rare supraventricular arrhythmias and ventricular ectopy, but no sustained arrhythmias. These results were reviewed by cardiology and deemed normal.  He saw a cardiologist on October 29, 2023, who discussed the possibility of weight loss interventions. His weight is noted to be 335 pounds. He has a family history of cardiac disorders. Patient is going to work on increasing physical activity and eating a heart healthy diet.   He denies any recent chest pain, shortness of breath or palpitations.      05/10/2023    2:11 PM 04/09/2023    1:39 PM  Depression screen PHQ 2/9  Decreased Interest 0 0  Down, Depressed, Hopeless 1 0  PHQ - 2 Score 1 0  Altered sleeping 1 3  Tired, decreased energy 1 2  Change in appetite 0 0  Feeling bad or failure about yourself  0 0  Trouble concentrating 0 2  Moving slowly or fidgety/restless 1 0  Suicidal thoughts 0 0  PHQ-9 Score 4 7  Difficult doing work/chores Somewhat difficult Somewhat difficult    Relevant past medical, surgical, family and social history reviewed and updated as indicated. Interim medical history since our  last visit reviewed. Allergies and medications reviewed and updated.  Review of Systems  Constitutional: Negative for fever or weight change.  Respiratory: Negative for cough and shortness of breath.   Cardiovascular: Negative for chest pain or palpitations.  Gastrointestinal: Negative for abdominal pain, no bowel changes.  Musculoskeletal: Negative for gait problem or joint swelling.  Skin: Negative for rash.  Neurological: Negative for dizziness or headache.  No other specific complaints in a complete review of systems (except as listed in HPI above).      Objective:    BP 128/84   Pulse 86   Resp 18   Ht 6\' 2"  (1.88 m)   Wt (!) 331 lb 6.4 oz (150.3 kg)   SpO2 94%   BMI 42.55 kg/m    Wt Readings from Last 3 Encounters:  11/21/23 (!) 331 lb 6.4 oz (150.3 kg)  10/29/23 (!) 335 lb (152 kg)  10/17/23 (!) 334 lb 1.6 oz (151.5 kg)    Physical Exam Physical Exam MEASUREMENTS: Weight- 335. GENERAL: Alert, cooperative, well developed, no acute distress. HEENT: Normocephalic, normal oropharynx, moist mucous membranes. CHEST: Clear to auscultation bilaterally, no wheezes, rhonchi, or crackles. CARDIOVASCULAR: Regular rate and rhythm, S1 and S2 normal without murmurs. ABDOMEN: Soft, non-tender, non-distended, without organomegaly, normal bowel sounds. EXTREMITIES: No cyanosis or edema. NEUROLOGICAL: Cranial  nerves grossly intact, moves all extremities without gross motor or sensory deficit.   Results for orders placed or performed in visit on 10/17/23  CBC with Differential/Platelet   Collection Time: 10/17/23  2:23 PM  Result Value Ref Range   WBC 7.9 3.8 - 10.8 Thousand/uL   RBC 5.80 4.20 - 5.80 Million/uL   Hemoglobin 15.5 13.2 - 17.1 g/dL   HCT 16.1 09.6 - 04.5 %   MCV 80.5 80.0 - 100.0 fL   MCH 26.7 (L) 27.0 - 33.0 pg   MCHC 33.2 32.0 - 36.0 g/dL   RDW 40.9 81.1 - 91.4 %   Platelets 289 140 - 400 Thousand/uL   MPV 11.0 7.5 - 12.5 fL   Neutro Abs 4,859 1,500 - 7,800  cells/uL   Absolute Lymphocytes 2,338 850 - 3,900 cells/uL   Absolute Monocytes 482 200 - 950 cells/uL   Eosinophils Absolute 158 15 - 500 cells/uL   Basophils Absolute 63 0 - 200 cells/uL   Neutrophils Relative % 61.5 %   Total Lymphocyte 29.6 %   Monocytes Relative 6.1 %   Eosinophils Relative 2.0 %   Basophils Relative 0.8 %  COMPLETE METABOLIC PANEL WITH GFR   Collection Time: 10/17/23  2:23 PM  Result Value Ref Range   Glucose, Bld 88 65 - 99 mg/dL   BUN 13 7 - 25 mg/dL   Creat 7.82 9.56 - 2.13 mg/dL   eGFR 086 > OR = 60 VH/QIO/9.62X5   BUN/Creatinine Ratio SEE NOTE: 6 - 22 (calc)   Sodium 138 135 - 146 mmol/L   Potassium 4.5 3.5 - 5.3 mmol/L   Chloride 101 98 - 110 mmol/L   CO2 25 20 - 32 mmol/L   Calcium 9.9 8.6 - 10.3 mg/dL   Total Protein 7.4 6.1 - 8.1 g/dL   Albumin 4.3 3.6 - 5.1 g/dL   Globulin 3.1 1.9 - 3.7 g/dL (calc)   AG Ratio 1.4 1.0 - 2.5 (calc)   Total Bilirubin 0.6 0.2 - 1.2 mg/dL   Alkaline phosphatase (APISO) 102 36 - 130 U/L   AST 19 10 - 40 U/L   ALT 29 9 - 46 U/L  TSH   Collection Time: 10/17/23  2:23 PM  Result Value Ref Range   TSH 1.42 0.40 - 4.50 mIU/L       Assessment & Plan:   Problem List Items Addressed This Visit   None    Assessment and Plan Assessment & Plan Chest Pain and Palpitations   Presents with chest pain, palpitations, and dyspnea. Family history of cardiac disorders. Initial workup including chest x-ray, TSH, CMP, and CBC was normal. Zio patch showed rare supraventricular arrhythmias and ventricular ectopy, no sustained arrhythmias. Cardiology reviewed results and deemed them normal. Symptoms attributed to deconditioning.   - Emphasize increased physical activity   - Advise maintaining hydration, especially in warmer weather    Obesity   Weighs 335 pounds. Cardiologist discussed potential use of Wegovy for weight loss. Weight may contribute to deconditioning and associated symptoms.   - Discuss potential weight loss  strategies      Follow up plan: Return if symptoms worsen or fail to improve.

## 2024-03-07 ENCOUNTER — Other Ambulatory Visit: Payer: Self-pay

## 2024-03-07 ENCOUNTER — Emergency Department: Admission: EM | Admit: 2024-03-07 | Discharge: 2024-03-07 | Disposition: A | Discharge status: Home / Self Care

## 2024-03-07 DIAGNOSIS — K0889 Other specified disorders of teeth and supporting structures: Secondary | ICD-10-CM | POA: Insufficient documentation

## 2024-03-07 DIAGNOSIS — R220 Localized swelling, mass and lump, head: Secondary | ICD-10-CM | POA: Diagnosis present

## 2024-03-07 MED ORDER — IBUPROFEN 800 MG PO TABS: 800.0000 mg | ORAL_TABLET | Freq: Three times a day (TID) | ORAL | 0 refills | Status: AC | PRN

## 2024-03-07 NOTE — Discharge Instructions (Signed)
 Please follow-up with your dentist.  You can take 650 mg of Tylenol  every 6 hours and 800 mg of ibuprofen every 8 hours as needed for pain. You can use ice, heat, muscle creams and other topical pain relievers as well.

## 2024-03-07 NOTE — ED Provider Notes (Signed)
 [facility] Provider Note    Event Date/Time   First MD Initiated Contact with Patient 03/07/24 1359     (approximate)   History   Facial Swelling   HPI  Gerald Davis is a 23 y.o. male with PMH of bipolar disorder, ADHD who presents for evaluation of left upper dental pain.  Patient states that he has had an abscessed tooth and has been on antibiotics for this for the past 3 weeks and recently ran out.  Reports the pain is intermittent and he has sensitivity to hot or cold foods.  Denies fevers.  States he had some facial swelling a couple days ago that is now improved.  He says he did not notice any liquid draining from the area but said when he pushed on it there was some gas that smelled bad.  This has since resolved.      Physical Exam   Triage Vital Signs: ED Triage Vitals  Encounter Vitals Group     BP 03/07/24 1348 136/78     Girls Systolic BP Percentile --      Girls Diastolic BP Percentile --      Boys Systolic BP Percentile --      Boys Diastolic BP Percentile --      Pulse Rate 03/07/24 1348 (!) 48     Resp 03/07/24 1348 20     Temp 03/07/24 1348 98.6 F (37 C)     Temp src --      SpO2 03/07/24 1348 99 %     Weight 03/07/24 1357 (!) 331 lb 5.6 oz (150.3 kg)     Height 03/07/24 1357 6' 2 (1.88 m)     Head Circumference --      Peak Flow --      Pain Score 03/07/24 1348 6     Pain Loc --      Pain Education --      Exclude from Growth Chart --     Most recent vital signs: Vitals:   03/07/24 1348  BP: 136/78  Pulse: (!) 48  Resp: 20  Temp: 98.6 F (37 C)  SpO2: 99%   General: Awake, no distress.  CV:  Good peripheral perfusion.  Resp:  Normal effort. Abd:  No distention.  Other:  Oral mucous membranes are moist, there is tenderness to percussion over the left first bicuspid, no erythema, no fluctuance identified, no obvious decay   ED Results / Procedures / Treatments   Labs (all labs ordered are listed, but only  abnormal results are displayed) Labs Reviewed - No data to display   PROCEDURES:  Critical Care performed: No  Procedures   MEDICATIONS ORDERED IN ED: Medications - No data to display   IMPRESSION / MDM / ASSESSMENT AND PLAN / ED COURSE  I reviewed the triage vital signs and the nursing notes.                             23 year old male presents for evaluation of dental pain.  Vital signs stable aside from a low heart rate.  Patient NAD on exam.  Differential diagnosis includes, but is not limited to, dental infection, dental abscess, gingivitis, cavity.  Patient's presentation is most consistent with acute, uncomplicated illness.  It sounds like patient may have had an abscess that ruptured earlier this week.  He does not have one on exam at this time.  Do not see  other signs of infection either, suspect that his pain is due to a cavity.  Since patient was just on antibiotics for a few weeks hesitant to start him on it again.  Recommended that he follow-up with his dentist and he has an appointment for next week.  Encouraged over-the-counter pain medication use.  We reviewed dosing instructions.  He was given reassurance.  He voiced understanding, all questions were answered and he was stable at discharge.     FINAL CLINICAL IMPRESSION(S) / ED DIAGNOSES   Final diagnoses:  Pain, dental     Rx / DC Orders   ED Discharge Orders          Ordered    ibuprofen (ADVIL) 800 MG tablet  Every 8 hours PRN        03/07/24 1440             Note:  This document was prepared using Dragon voice recognition software and may include unintentional dictation errors.   Cleaster Tinnie LABOR, PA-C 03/07/24 1442    Clarine Ozell LABOR, MD 03/07/24 [phone]

## 2024-03-07 NOTE — ED Triage Notes (Signed)
 Pt to ED via POV from home. Pt reports left dental pain for months. Unable to be seen by dentist.

## 2024-03-12 ENCOUNTER — Other Ambulatory Visit: Payer: Self-pay | Admitting: Nurse Practitioner

## 2024-03-13 NOTE — Telephone Encounter (Signed)
 Requested medications are due for refill today.  unsure  Requested medications are on the active medications list.  yes  Last refill. 10/20/2023   Future visit scheduled.   yes  Notes to clinic.  Medication is historical.    Requested Prescriptions  Pending Prescriptions Disp Refills   omeprazole  (PRILOSEC) 20 MG capsule [Pharmacy Med Name: OMEPRAZOLE  DR 20 MG CAP] 90 capsule     Sig: TAKE 1 CAPSULE BY MOUTH ONCE DAILY     Gastroenterology: Proton Pump Inhibitors Passed - 03/13/2024 10:13 AM      Passed - Valid encounter within last 12 months    Recent Outpatient Visits           3 months ago Palpitations   [facility] Gareth Mliss FALCON, FNP   4 months ago Chest pain, unspecified type   [facility] Gareth Mliss FALCON, FNP       Future Appointments             In 4 weeks Gareth, Mliss FALCON, FNP [facility]

## 2024-04-03 ENCOUNTER — Encounter: Payer: Self-pay | Admitting: Emergency Medicine

## 2024-04-03 ENCOUNTER — Other Ambulatory Visit: Payer: Self-pay

## 2024-04-03 ENCOUNTER — Emergency Department: Admission: EM | Admit: 2024-04-03 | Discharge: 2024-04-03 | Disposition: A | Discharge status: Home / Self Care

## 2024-04-03 DIAGNOSIS — R6881 Early satiety: Secondary | ICD-10-CM | POA: Insufficient documentation

## 2024-04-03 DIAGNOSIS — R112 Nausea with vomiting, unspecified: Secondary | ICD-10-CM | POA: Diagnosis present

## 2024-04-03 DIAGNOSIS — R110 Nausea: Secondary | ICD-10-CM

## 2024-04-03 LAB — CBC WITH DIFFERENTIAL/PLATELET
Abs Immature Granulocytes: 0.02 K/uL (ref 0.00–0.07)
Basophils Absolute: 0.1 K/uL (ref 0.0–0.1)
Basophils Relative: 1 %
Eosinophils Absolute: 0.3 K/uL (ref 0.0–0.5)
Eosinophils Relative: 4 %
HCT: 43.2 % (ref 39.0–52.0)
Hemoglobin: 13.8 g/dL (ref 13.0–17.0)
Immature Granulocytes: 0 %
Lymphocytes Relative: 31 %
Lymphs Abs: 2.2 K/uL (ref 0.7–4.0)
MCH: 26.4 pg (ref 26.0–34.0)
MCHC: 31.9 g/dL (ref 30.0–36.0)
MCV: 82.8 fL (ref 80.0–100.0)
Monocytes Absolute: 0.6 K/uL (ref 0.1–1.0)
Monocytes Relative: 8 %
Neutro Abs: 3.9 K/uL (ref 1.7–7.7)
Neutrophils Relative %: 56 %
Platelets: 275 K/uL (ref 150–400)
RBC: 5.22 MIL/uL (ref 4.22–5.81)
RDW: 12.7 % (ref 11.5–15.5)
WBC: 6.9 K/uL (ref 4.0–10.5)
nRBC: 0 % (ref 0.0–0.2)

## 2024-04-03 LAB — COMPREHENSIVE METABOLIC PANEL WITH GFR
ALT: 22 U/L (ref 0–44)
AST: 22 U/L (ref 15–41)
Albumin: 4.2 g/dL (ref 3.5–5.0)
Alkaline Phosphatase: 75 U/L (ref 38–126)
Anion gap: 14 (ref 5–15)
BUN: 10 mg/dL (ref 6–20)
CO2: 25 mmol/L (ref 22–32)
Calcium: 9.4 mg/dL (ref 8.9–10.3)
Chloride: 99 mmol/L (ref 98–111)
Creatinine, Ser: 1.09 mg/dL (ref 0.61–1.24)
GFR, Estimated: >60 mL/min (ref >60)
Glucose, Bld: 86 mg/dL (ref 70–99)
Potassium: 3.4 mmol/L — ABNORMAL LOW (ref 3.5–5.1)
Sodium: 138 mmol/L (ref 135–145)
Total Bilirubin: 0.9 mg/dL (ref 0.0–1.2)
Total Protein: 7.9 g/dL (ref 6.5–8.1)

## 2024-04-03 LAB — LIPASE, BLOOD: Lipase: 44 U/L (ref 11–51)

## 2024-04-03 MED ORDER — FAMOTIDINE 20 MG PO TABS: 20.0000 mg | ORAL_TABLET | Freq: Every day | ORAL | 0 refills | Status: AC

## 2024-04-03 MED ORDER — ALUM & MAG HYDROXIDE-SIMETH 200-200-20 MG/5ML PO SUSP
30.0000 mL | Freq: Once | ORAL | Status: AC
  Administered 2024-04-03: 30 mL via ORAL
  Filled 2024-04-03: qty 30

## 2024-04-03 MED ORDER — ONDANSETRON 4 MG PO TBDP
4.0000 mg | ORAL_TABLET | Freq: Once | ORAL | Status: DC
  Filled 2024-04-03: qty 1

## 2024-04-03 MED ORDER — ONDANSETRON 4 MG PO TBDP
4.0000 mg | ORAL_TABLET | Freq: Once | ORAL | Status: AC
  Administered 2024-04-03: 4 mg via ORAL

## 2024-04-03 MED ORDER — FAMOTIDINE 20 MG PO TABS
20.0000 mg | ORAL_TABLET | Freq: Once | ORAL | Status: AC
  Administered 2024-04-03: 20 mg via ORAL
  Filled 2024-04-03: qty 1

## 2024-04-03 MED ORDER — ONDANSETRON 4 MG PO TBDP: 4.0000 mg | ORAL_TABLET | Freq: Three times a day (TID) | ORAL | 0 refills | Status: AC | PRN

## 2024-04-03 NOTE — ED Notes (Signed)
Pt and visitor given warm blankets. °

## 2024-04-03 NOTE — ED Notes (Signed)
 Lab called due to pt being difficult stick. Attempted twice. Lab stated it would be 06:00 before anyone could come stick. First nurse, Olam, notified.

## 2024-04-03 NOTE — ED Triage Notes (Addendum)
 Patient ambulatory to triage with steady gait, without difficulty or distress noted; pt reports N/V since last night; denies any abd pain; st I think it may be nicotine withdrawal

## 2024-04-03 NOTE — ED Provider Notes (Signed)
 [facility] Provider Note    Event Date/Time   First MD Initiated Contact with Patient 04/03/24 [phone]     (approximate)   History   Emesis   HPI  Gerald Davis is a 23 y.o. male with past medical history of prior smoking (states he quit 3 days ago), 'legal' THC use who presents with 12 hours of nausea vomiting and the feeling of early satiety.  Patient states that he quit nicotine 3 days ago and wonders whether he may be going through withdrawal.  He denies any abdominal pain any flank pain any chest pain or shortness of breath.  He states that he just does not feel hungry.  He has not had any fevers.  No prior history of abdominal surgeries.  Last bowel movement was yesterday and was normal.  No pain with urination.  His significant other presents with him and contributes to the history      Physical Exam   Triage Vital Signs: ED Triage Vitals  Encounter Vitals Group     BP 04/03/24 0521 118/73     Girls Systolic BP Percentile --      Girls Diastolic BP Percentile --      Boys Systolic BP Percentile --      Boys Diastolic BP Percentile --      Pulse Rate 04/03/24 0521 (!) 55     Resp 04/03/24 0521 20     Temp 04/03/24 0521 (!) 97.5 F (36.4 C)     Temp Source 04/03/24 0521 Oral     SpO2 04/03/24 0521 100 %     Weight 04/03/24 0513 (!) 330 lb (149.7 kg)     Height 04/03/24 0513 6' 3 (1.905 m)     Head Circumference --      Peak Flow --      Pain Score 04/03/24 0513 0     Pain Loc --      Pain Education --      Exclude from Growth Chart --     Most recent vital signs: Vitals:   04/03/24 0521  BP: 118/73  Pulse: (!) 55  Resp: 20  Temp: (!) 97.5 F (36.4 C)  SpO2: 100%    Nursing Triage Note reviewed. Vital signs reviewed and patients oxygen saturation is normoxic  General: Patient is well nourished, well developed, awake and alert, resting comfortably in no acute distress Head: Normocephalic and atraumatic Eyes: Normal  inspection, extraocular muscles intact, no conjunctival pallor Ear, nose, throat: Normal external exam Neck: Normal range of motion Respiratory: Patient is in no respiratory distress, lungs CTAB Cardiovascular: Patient is not tachycardic, RRR without murmur appreciated GI: Abd SNT with no guarding or rebound  Back: Normal inspection of the back with good strength and range of motion throughout all ext Extremities: pulses intact with good cap refills, no LE pitting edema or calf tenderness Neuro: The patient is alert and oriented to person, place, and time, appropriately conversive, with 5/5 bilat UE/LE strength, no gross motor or sensory defects noted. Coordination appears to be adequate. Skin: Warm, dry, and intact Psych: normal mood and affect, no SI or HI  ED Results / Procedures / Treatments   Labs (all labs ordered are listed, but only abnormal results are displayed) Labs Reviewed  COMPREHENSIVE METABOLIC PANEL WITH GFR - Abnormal; Notable for the following components:      Result Value   Potassium 3.4 (*)    All other components within normal limits  CBC  WITH DIFFERENTIAL/PLATELET  LIPASE, BLOOD  URINALYSIS, ROUTINE W REFLEX MICROSCOPIC     EKG None  RADIOLOGY None    PROCEDURES:  Critical Care performed: No  Procedures   MEDICATIONS ORDERED IN ED: Medications  ondansetron  (ZOFRAN -ODT) disintegrating tablet 4 mg (4 mg Oral Not Given 04/03/24 0815)  ondansetron  (ZOFRAN -ODT) disintegrating tablet 4 mg (4 mg Oral Given 04/03/24 0814)  famotidine  (PEPCID ) tablet 20 mg (20 mg Oral Given 04/03/24 0814)  alum & mag hydroxide-simeth (MAALOX/MYLANTA) 200-200-20 MG/5ML suspension 30 mL (30 mLs Oral Given 04/03/24 0814)     IMPRESSION / MDM / ASSESSMENT AND PLAN / ED COURSE                                Differential diagnosis includes, but is not limited to, URI, nicotine withdrawal, cannabinoid hyperemesis, electrolyte derangement, anemia, pancreatitis  ED course:  Patient is extremely well-appearing and abdominal exam is completely benign.  He does not have a leukocytosis or any anemia.  No acute renal insufficiency or any elevation of his LFTs or lipase.  I did consider ordering a COVID test however we will hold off as this would not change my management at this point.  Patient felt improved after oral Zofran  ODT, Pepcid  and a GI cocktail.  He was able to tolerate p.o.  I did send him scripts for Zofran  and Pepcid  to his pharmacy of choice.  He was encouraged to follow-up with his primary care physician later this week and return if any acutely worsening symptoms   Clinical Course as of 04/03/24 0821  Thu Apr 03, 2024  0811 Patient states he feels improved and requesting discharge [HD]    Clinical Course User Index [HD] Nicholaus Rolland BRAVO, MD   At time of discharge there is no evidence of acute life, limb, vision, or fertility threat. Patient has stable vital signs, pain is well controlled, patient is ambulatory and p.o. tolerant.  Discharge instructions were completed using the [employer] system. I would refer you to those at this time. All warnings prescriptions follow-up etc. were discussed in detail with the patient. Patient indicates understanding and is agreeable with this plan. All questions answered.  Patient is made aware that they may return to the emergency department for any worsening or new condition or for any other emergency. -- Suggested E/M Coding Level: 4, 99284  This level has been selected based on the 2022/04/18 CPT guidelines for E/M codes in the Emergency Department based on 2/3 of the CoPA, Data, and Risk.  COPA: The patient has an acute illness with systemic symptoms: N/v Risk: This patient has a moderate risk of morbidity as evidenced by the following further diagnostic testing or treatment actions: Prescription drug management   FINAL CLINICAL IMPRESSION(S) / ED DIAGNOSES   Final diagnoses:  Nausea  Early satiety     Rx / DC  Orders   ED Discharge Orders          Ordered    ondansetron  (ZOFRAN -ODT) 4 MG disintegrating tablet  Every 8 hours PRN        04/03/24 0814    famotidine  (PEPCID ) 20 MG tablet  Daily        04/03/24 0814             Note:  This document was prepared using Dragon voice recognition software and may include unintentional dictation errors.   Nicholaus Rolland BRAVO, MD 04/03/24 [phone]

## 2024-04-03 NOTE — ED Notes (Signed)
 Pt and visitor to front desk inquiring about how many people are in front of pt, visitor informed we cannot give out wait times. Visitor verbalized understanding. Visitor and pt walked outside and are sitting on front bench. Pt ambulatory and in NAD.

## 2024-04-03 NOTE — Discharge Instructions (Addendum)
 You were seen in the emergency department for nausea and earl satiety.  For the next 24 hours please stick to bland foods.  Fluids are more important than solids.  Continue to stop the nicotine (congrats).  Rest.  Call your primary care physician and arrange follow-up.  Should you develop progressively worsening abdominal pain that is intractable please return for evaluation.  It was very nice meeting you and I wish you the best of luck -- RETURN PRECAUTIONS & AFTERCARE: (ENGLISH) RETURN PRECAUTIONS: Return immediately to the emergency department or see/call your doctor if you feel worse, weak or have changes in speech or vision, are short of breath, have fever, vomiting, pain, bleeding or dark stool, trouble urinating or any new issues. Return here or see/call your doctor if not improving as expected for your suspected condition. FOLLOW-UP CARE: Call your doctor and/or any doctors we referred you to for more advice and to make an appointment. Do this today, tomorrow or after the weekend. Some doctors only take PPO insurance so if you have HMO insurance you may want to contact your HMO or your regular doctor for referral to a specialist within your plan. Either way tell the doctor's office that it was a referral from the emergency department so you get the soonest possible appointment.  YOUR TEST RESULTS: Take result reports of any blood or urine tests, imaging tests and EKG's to your doctor and any referral doctor. Have any abnormal tests repeated. Your doctor or a referral doctor can let you know when this should be done. Also make sure your doctor contacts this hospital to get any test results that are not currently available such as cultures or special tests for infection and final imaging reports, which are often not available at the time you leave the ER but which may list additional important findings that are not documented on the preliminary report. BLOOD PRESSURE: If your blood pressure was greater  than 120/80 have your blood pressure rechecked within 1 to 2 weeks. MEDICATION SIDE EFFECTS: Do not drive, walk, bike, take the bus, etc. if you have received or are being prescribed any sedating medications such as those for pain or anxiety or certain antihistamines like Benadryl. If you have been give one of these here get a taxi home or have a friend drive you home. Ask your pharmacist to counsel you on potential side effects of any new medication

## 2024-04-07 ENCOUNTER — Encounter: Payer: Self-pay | Admitting: Nurse Practitioner

## 2024-04-07 ENCOUNTER — Ambulatory Visit (INDEPENDENT_AMBULATORY_CARE_PROVIDER_SITE_OTHER): Admitting: Nurse Practitioner

## 2024-04-07 VITALS — BP 106/72 | HR 69 | Resp 18 | Ht 75.0 in | Wt 315.3 lb

## 2024-04-07 DIAGNOSIS — R197 Diarrhea, unspecified: Secondary | ICD-10-CM | POA: Diagnosis not present

## 2024-04-07 DIAGNOSIS — R630 Anorexia: Secondary | ICD-10-CM | POA: Diagnosis not present

## 2024-04-07 DIAGNOSIS — R634 Abnormal weight loss: Secondary | ICD-10-CM | POA: Diagnosis not present

## 2024-04-07 LAB — POCT URINALYSIS DIPSTICK
Bilirubin, UA: NEGATIVE
Blood, UA: NEGATIVE
Glucose, UA: NEGATIVE
Ketones, UA: NEGATIVE
Leukocytes, UA: NEGATIVE
Nitrite, UA: NEGATIVE
Odor: NORMAL
Protein, UA: NEGATIVE
Spec Grav, UA: 1.02 (ref 1.010–1.025)
Urobilinogen, UA: 0.2 U/dL
pH, UA: 6 (ref 5.0–8.0)

## 2024-04-07 NOTE — Progress Notes (Signed)
 BP 106/72   Pulse 69   Resp 18   Ht 6' 3 (1.905 m)   Wt (!) 315 lb 4.8 oz (143 kg)   SpO2 98%   BMI 39.41 kg/m    Subjective:    Patient ID: Gerald Davis, male    DOB: Dec 08, 2000, 23 y.o.   MRN: [id number]  HPI: Gerald Davis is a 23 y.o. male  Chief Complaint  Patient presents with   Weight Loss    Nausea and dizziness    Discussed the use of AI scribe software for clinical note transcription with the patient, who gave verbal consent to proceed.  History of Present Illness Gerald Davis is a 23 year old male who presents with nausea, loss of appetite, and unintentional weight loss.  Gastrointestinal symptoms - Nausea and significant loss of appetite for the past two weeks - Appetite is slowly improving; able to eat a small meal and a slushy recently - Diarrhea for approximately one to two weeks - No abdominal pain - Dry mouth present - No fevers or chills  Unintentional weight loss - Unintentional weight loss of approximately 16 pounds over the past several months - Weight was 331 pounds on November 21, 2023; current weight is 315 pounds  Medication adherence and psychiatric symptoms - Forgot to take medication for about one week, resulting in increased anxiety attacks and fatigue - Fatigue characterized by feeling ready to sleep by 9 PM - Resumed medication two days ago with improvement in symptoms - Inquires about taking two dissolving Zofran  4 mg tablets during the day for symptom management         05/10/2023    2:11 PM 04/09/2023    1:39 PM  Depression screen PHQ 2/9  Decreased Interest 0 0  Down, Depressed, Hopeless 1 0  PHQ - 2 Score 1 0  Altered sleeping 1 3  Tired, decreased energy 1 2  Change in appetite 0 0  Feeling bad or failure about yourself  0 0  Trouble concentrating 0 2  Moving slowly or fidgety/restless 1 0  Suicidal thoughts 0 0  PHQ-9 Score 4 7  Difficult doing work/chores Somewhat difficult Somewhat difficult    Relevant past  medical, surgical, family and social history reviewed and updated as indicated. Interim medical history since our last visit reviewed. Allergies and medications reviewed and updated.  Review of Systems  Ten systems reviewed and is negative except as mentioned in HPI      Objective:     BP 106/72   Pulse 69   Resp 18   Ht 6' 3 (1.905 m)   Wt (!) 315 lb 4.8 oz (143 kg)   SpO2 98%   BMI 39.41 kg/m    Wt Readings from Last 3 Encounters:  04/07/24 (!) 315 lb 4.8 oz (143 kg)  04/03/24 (!) 330 lb (149.7 kg)  03/07/24 (!) 331 lb 5.6 oz (150.3 kg)    Physical Exam Physical Exam MEASUREMENTS: Weight- 315. GENERAL: Alert, cooperative, well developed, no acute distress HEENT: Normocephalic, normal oropharynx, moist mucous membranes CHEST: Clear to auscultation bilaterally, no wheezes, rhonchi, or crackles CARDIOVASCULAR: Normal heart rate and rhythm, S1 and S2 normal without murmurs ABDOMEN: Soft, non-tender, non-distended, without organomegaly, normal bowel sounds EXTREMITIES: No cyanosis or edema NEUROLOGICAL: Cranial nerves grossly intact, moves all extremities without gross motor or sensory deficit   Results for orders placed or performed in visit on 04/07/24  POCT urinalysis dipstick   Collection Time: 04/07/24  2:33 PM  Result Value Ref Range   Color, UA yellow    Clarity, UA clear    Glucose, UA Negative Negative   Bilirubin, UA neg    Ketones, UA neg    Spec Grav, UA 1.020 1.010 - 1.025   Blood, UA neg    pH, UA 6.0 5.0 - 8.0   Protein, UA Negative Negative   Urobilinogen, UA 0.2 0.2 or 1.0 E.U./dL   Nitrite, UA neg    Leukocytes, UA Negative Negative   Appearance clear    Odor normal           Assessment & Plan:   Problem List Items Addressed This Visit   None Visit Diagnoses       Weight loss, unintentional    -  Primary   Relevant Orders   TSH   Stool Giardia/Cryptosporidium   C-reactive protein   CALPROTECTIN   CBC with Differential/Platelet    Comprehensive metabolic panel with GFR   Hemoglobin A1c   Iron, TIBC and Ferritin Panel   Ova and parasite examination   POCT urinalysis dipstick (Completed)   Salmonella/Shigella Cult, Campy EIA and Shiga Toxin reflex   Sedimentation rate   Calprotectin, Fecal   Stool culture     Loss of appetite       Relevant Orders   TSH   Stool Giardia/Cryptosporidium   C-reactive protein   CALPROTECTIN   CBC with Differential/Platelet   Comprehensive metabolic panel with GFR   Hemoglobin A1c   Iron, TIBC and Ferritin Panel   Ova and parasite examination   POCT urinalysis dipstick (Completed)   Salmonella/Shigella Cult, Campy EIA and Shiga Toxin reflex   Sedimentation rate   Calprotectin, Fecal   Stool culture     Diarrhea, unspecified type       Relevant Orders   TSH   Stool Giardia/Cryptosporidium   C-reactive protein   CALPROTECTIN   CBC with Differential/Platelet   Comprehensive metabolic panel with GFR   Hemoglobin A1c   Iron, TIBC and Ferritin Panel   Ova and parasite examination   POCT urinalysis dipstick (Completed)   Salmonella/Shigella Cult, Campy EIA and Shiga Toxin reflex   Sedimentation rate   Calprotectin, Fecal   Stool culture        Assessment and Plan Assessment & Plan Unintentional weight loss with loss of appetite Unintentional weight loss of 16 pounds over 1-2 weeks, associated with loss of appetite. Differential diagnosis includes medication nonadherence. Symptoms likely due to missing medication doses. - Order CBC, CMP, iron panel, sed rate, CRP, TSH, and A1c to evaluate for metabolic or endocrine disorders - Monitor weight and appetite - Reassess based on lab results  Diarrhea Diarrhea present for 1-2 weeks. Potential cause includes medication nonadherence. Low potassium levels are not considered a likely cause of weight loss. - Order stool culture to check for parasites - Reassess based on lab results  Dry mouth Dry mouth noted.  Medication  nonadherence Nonadherence to medication regimen noted, leading to exacerbation of symptoms including anxiety, fatigue, and possibly contributing to weight loss and gastrointestinal symptoms. He has resumed medication and reports improvement. - Allow use of two 4 mg Zofran  tablets as needed for nausea  Fatigue and anxiety symptoms Fatigue and anxiety symptoms exacerbated by medication nonadherence. Symptoms have improved since resuming medication.        Follow up plan: Return if symptoms worsen or fail to improve.

## 2024-04-09 ENCOUNTER — Ambulatory Visit: Payer: Self-pay | Admitting: Nurse Practitioner

## 2024-04-09 ENCOUNTER — Ambulatory Visit: Payer: Self-pay

## 2024-04-09 NOTE — Telephone Encounter (Signed)
 FYI Only or Action Required?: Action required by provider: lab or test result follow-up needed.  Patient was last seen in primary care on 04/07/2024 by Gareth Mliss FALCON, FNP.  Called Nurse Triage reporting Advice Only.  Triage Disposition: Call PCP Within 24 Hours-needing a phone call after lab work reviewed by provider.   Patient/caregiver understands and will follow disposition?: Yes  Copied from CRM [id number]. Topic: Clinical - Lab/Test Results >> Apr 09, 2024 11:17 AM Rosaria BRAVO wrote: Reason for CRM: Has questions about labs, pt's father is calling. Ly is with him. Reason for Disposition  [1] Caller requests to speak ONLY to PCP AND [2] NON-URGENT question  Answer Assessment - Initial Assessment Questions 1. REASON FOR CALL or QUESTION: What is your reason for calling today? or How can I best     Calling with questions about lab work that resulted yesterday. Needing lab work reviewed by provider.  2. CALLER: Document the source of call. (e.g., laboratory staff, caregiver or patient).     Patient and father  Protocols used: PCP Call - No Triage-A-AH

## 2024-04-10 ENCOUNTER — Encounter: Payer: Self-pay | Admitting: Nurse Practitioner

## 2024-04-10 LAB — HEMOGLOBIN A1C
Est. average glucose Bld gHb Est-mCnc: 105 mg/dL
Hgb A1c MFr Bld: 5.3 % (ref 4.8–5.6)

## 2024-04-10 LAB — IRON,TIBC AND FERRITIN PANEL
Ferritin: 139 ng/mL (ref 30–400)
Iron Saturation: 27 % (ref 15–55)
Iron: 90 ug/dL (ref 38–169)
Total Iron Binding Capacity: 333 ug/dL (ref 250–450)
UIBC: 243 ug/dL (ref 111–343)

## 2024-04-10 LAB — COMPREHENSIVE METABOLIC PANEL WITH GFR
ALT: 23 IU/L (ref 0–44)
AST: 18 IU/L (ref 0–40)
Albumin: 4.8 g/dL (ref 4.3–5.2)
Alkaline Phosphatase: 96 IU/L (ref 44–121)
BUN/Creatinine Ratio: 10 (ref 9–20)
BUN: 12 mg/dL (ref 6–20)
Bilirubin Total: 0.6 mg/dL (ref 0.0–1.2)
CO2: 22 mmol/L (ref 20–29)
Calcium: 10.1 mg/dL (ref 8.7–10.2)
Chloride: 101 mmol/L (ref 96–106)
Creatinine, Ser: 1.22 mg/dL (ref 0.76–1.27)
Globulin, Total: 2.9 g/dL (ref 1.5–4.5)
Glucose: 83 mg/dL (ref 70–99)
Potassium: 4.4 mmol/L (ref 3.5–5.2)
Sodium: 141 mmol/L (ref 134–144)
Total Protein: 7.7 g/dL (ref 6.0–8.5)
eGFR: 86 mL/min/1.73 (ref >59)

## 2024-04-10 LAB — CBC WITH DIFFERENTIAL/PLATELET
Basophils Absolute: 0.1 x10E3/uL (ref 0.0–0.2)
Basos: 1 %
EOS (ABSOLUTE): 0.3 x10E3/uL (ref 0.0–0.4)
Eos: 4 %
Hematocrit: 47.1 % (ref 37.5–51.0)
Hemoglobin: 15.2 g/dL (ref 13.0–17.7)
Immature Grans (Abs): 0 x10E3/uL (ref 0.0–0.1)
Immature Granulocytes: 0 %
Lymphocytes Absolute: 2.3 x10E3/uL (ref 0.7–3.1)
Lymphs: 32 %
MCH: 26.5 pg — ABNORMAL LOW (ref 26.6–33.0)
MCHC: 32.3 g/dL (ref 31.5–35.7)
MCV: 82 fL (ref 79–97)
Monocytes Absolute: 0.3 x10E3/uL (ref 0.1–0.9)
Monocytes: 5 %
Neutrophils Absolute: 4.2 x10E3/uL (ref 1.4–7.0)
Neutrophils: 58 %
Platelets: 285 x10E3/uL (ref 150–450)
RBC: 5.73 x10E6/uL (ref 4.14–5.80)
RDW: 12.2 % (ref 11.6–15.4)
WBC: 7.1 x10E3/uL (ref 3.4–10.8)

## 2024-04-10 LAB — OVA AND PARASITE EXAMINATION

## 2024-04-10 LAB — CALPROTECTIN, FECAL: Calprotectin, Fecal: 114 ug/g (ref 0–120)

## 2024-04-10 LAB — SEDIMENTATION RATE: Sed Rate: 23 mm/h — ABNORMAL HIGH (ref 0–15)

## 2024-04-10 LAB — C-REACTIVE PROTEIN: CRP: 6 mg/L (ref 0–10)

## 2024-04-10 NOTE — Progress Notes (Deleted)
 [name]

## 2024-04-11 ENCOUNTER — Other Ambulatory Visit: Payer: Self-pay | Admitting: Nurse Practitioner

## 2024-04-12 LAB — STOOL CULTURE: E coli, Shiga toxin Assay: NEGATIVE

## 2024-04-13 NOTE — Telephone Encounter (Signed)
 Requested medication (s) are due for refill today: No  Requested medication (s) are on the active medication list: No  Last refill:  03/12/24  Future visit scheduled: No  Notes to clinic:  Unable to refill per protocol, cannot delegate.      Requested Prescriptions  Pending Prescriptions Disp Refills   clobetasol  (TEMOVATE ) 0.05 % external solution [Pharmacy Med Name: CLOBETASOL  PROPIONATE 0.05% TOP SOL] 50 mL     Sig: APPLY TO AFFECTED AREA(s) TWICE DAILY     Not Delegated - Dermatology:  Corticosteroids Failed - 04/13/2024 10:23 PM      Failed - This refill cannot be delegated      Passed - Valid encounter within last 12 months    Recent Outpatient Visits           6 days ago Weight loss, unintentional   [facility] Gareth Mliss FALCON, FNP   4 months ago Palpitations   [facility] Gareth Mliss FALCON, FNP   5 months ago Chest pain, unspecified type   [facility] Gareth Mliss FALCON, [place]

## 2024-04-25 ENCOUNTER — Ambulatory Visit: Admitting: Nurse Practitioner

## 2024-04-25 NOTE — Progress Notes (Deleted)
 There were no vitals taken for this visit.   Subjective:    Patient ID: Gerald Davis, male    DOB: 06-21-2001, 23 y.o.   MRN: [id number]  HPI: Gerald Davis is a 23 y.o. male  No chief complaint on file.  Discussed the use of AI scribe software for clinical note transcription with the patient, who gave verbal consent to proceed.  History of Present Illness          05/10/2023    2:11 PM 04/09/2023    1:39 PM  Depression screen PHQ 2/9  Decreased Interest 0 0  Down, Depressed, Hopeless 1 0  PHQ - 2 Score 1 0  Altered sleeping 1 3  Tired, decreased energy 1 2  Change in appetite 0 0  Feeling bad or failure about yourself  0 0  Trouble concentrating 0 2  Moving slowly or fidgety/restless 1 0  Suicidal thoughts 0 0  PHQ-9 Score 4 7  Difficult doing work/chores Somewhat difficult Somewhat difficult    Relevant past medical, surgical, family and social history reviewed and updated as indicated. Interim medical history since our last visit reviewed. Allergies and medications reviewed and updated.  Review of Systems  Per HPI unless specifically indicated above     Objective:      There were no vitals taken for this visit.  {Vitals History (Optional):23777} Wt Readings from Last 3 Encounters:  04/07/24 (!) 315 lb 4.8 oz (143 kg)  04/03/24 (!) 330 lb (149.7 kg)  03/07/24 (!) 331 lb 5.6 oz (150.3 kg)    Physical Exam   Results for orders placed or performed in visit on 04/07/24  POCT urinalysis dipstick   Collection Time: 04/07/24  2:33 PM  Result Value Ref Range   Color, UA yellow    Clarity, UA clear    Glucose, UA Negative Negative   Bilirubin, UA neg    Ketones, UA neg    Spec Grav, UA 1.020 1.010 - 1.025   Blood, UA neg    pH, UA 6.0 5.0 - 8.0   Protein, UA Negative Negative   Urobilinogen, UA 0.2 0.2 or 1.0 E.U./dL   Nitrite, UA neg    Leukocytes, UA Negative Negative   Appearance clear    Odor normal   Calprotectin, Fecal   Collection Time:  04/08/24  3:47 PM   Specimen: Stool  Result Value Ref Range   Calprotectin, Fecal 114 0 - 120 ug/g  C-reactive protein   Collection Time: 04/08/24  3:47 PM  Result Value Ref Range   CRP 6 0 - 10 mg/L  CBC with Differential/Platelet   Collection Time: 04/08/24  3:47 PM  Result Value Ref Range   WBC 7.1 3.4 - 10.8 x10E3/uL   RBC 5.73 4.14 - 5.80 x10E6/uL   Hemoglobin 15.2 13.0 - 17.7 g/dL   Hematocrit 52.8 62.4 - 51.0 %   MCV 82 79 - 97 fL   MCH 26.5 (L) 26.6 - 33.0 pg   MCHC 32.3 31.5 - 35.7 g/dL   RDW 87.7 88.3 - 84.5 %   Platelets 285 150 - 450 x10E3/uL   Neutrophils 58 Not Estab. %   Lymphs 32 Not Estab. %   Monocytes 5 Not Estab. %   Eos 4 Not Estab. %   Basos 1 Not Estab. %   Neutrophils Absolute 4.2 1.4 - 7.0 x10E3/uL   Lymphocytes Absolute 2.3 0.7 - 3.1 x10E3/uL   Monocytes Absolute 0.3 0.1 - 0.9 x10E3/uL  EOS (ABSOLUTE) 0.3 0.0 - 0.4 x10E3/uL   Basophils Absolute 0.1 0.0 - 0.2 x10E3/uL   Immature Granulocytes 0 Not Estab. %   Immature Grans (Abs) 0.0 0.0 - 0.1 x10E3/uL  Comprehensive metabolic panel with GFR   Collection Time: 04/08/24  3:47 PM  Result Value Ref Range   Glucose 83 70 - 99 mg/dL   BUN 12 6 - 20 mg/dL   Creatinine, Ser 8.77 0.76 - 1.27 mg/dL   eGFR 86 >40 fO/fpw/8.26   BUN/Creatinine Ratio 10 9 - 20   Sodium 141 134 - 144 mmol/L   Potassium 4.4 3.5 - 5.2 mmol/L   Chloride 101 96 - 106 mmol/L   CO2 22 20 - 29 mmol/L   Calcium 10.1 8.7 - 10.2 mg/dL   Total Protein 7.7 6.0 - 8.5 g/dL   Albumin 4.8 4.3 - 5.2 g/dL   Globulin, Total 2.9 1.5 - 4.5 g/dL   Bilirubin Total 0.6 0.0 - 1.2 mg/dL   Alkaline Phosphatase 96 44 - 121 IU/L   AST 18 0 - 40 IU/L   ALT 23 0 - 44 IU/L  Hemoglobin A1c   Collection Time: 04/08/24  3:47 PM  Result Value Ref Range   Hgb A1c MFr Bld 5.3 4.8 - 5.6 %   Est. average glucose Bld gHb Est-mCnc 105 mg/dL  Iron, TIBC and Ferritin Panel   Collection Time: 04/08/24  3:47 PM  Result Value Ref Range   Total Iron Binding  Capacity 333 250 - 450 ug/dL   UIBC 756 888 - 656 ug/dL   Iron 90 38 - 830 ug/dL   Iron Saturation 27 15 - 55 %   Ferritin 139 30 - 400 ng/mL  Sedimentation rate   Collection Time: 04/08/24  3:47 PM  Result Value Ref Range   Sed Rate 23 (H) 0 - 15 mm/hr  Ova and parasite examination   Collection Time: 04/08/24  5:07 PM   Specimen: Stool   ST  Result Value Ref Range   OVA + PARASITE EXAM Final report    O&P result 1 Comment   Stool culture   Collection Time: 04/08/24  5:08 PM   Specimen: Stool   ST  Result Value Ref Range   Salmonella/Shigella Screen Final report    Stool Culture result 1 (RSASHR) Comment    Campylobacter Culture Final report    Stool Culture result 1 (CMPCXR) Comment    E coli, Shiga toxin Assay Negative Negative   {Labs (Optional):23779}       Assessment & Plan:   Problem List Items Addressed This Visit   None    Assessment and Plan Assessment & Plan         Follow up plan: No follow-ups on file.

## 2024-05-10 ENCOUNTER — Other Ambulatory Visit: Payer: Self-pay | Admitting: Nurse Practitioner

## 2024-05-13 NOTE — Telephone Encounter (Signed)
 Requested medication (s) are due for refill today: routing for review  Requested medication (s) are on the active medication list: yes  Last refill:    Future visit scheduled: no  Notes to clinic:  Unable to refill per protocol, cannot delegate.      Requested Prescriptions  Pending Prescriptions Disp Refills   clobetasol  (TEMOVATE ) 0.05 % external solution [Pharmacy Med Name: CLOBETASOL  PROPIONATE 0.05% TOP SOL] 50 mL     Sig: APPLY TO AFFECTED AREA(s) TWICE DAILY     Not Delegated - Dermatology:  Corticosteroids Failed - 05/13/2024 11:55 AM      Failed - This refill cannot be delegated      Passed - Valid encounter within last 12 months    Recent Outpatient Visits           1 month ago Weight loss, unintentional   [facility] Gareth Mliss FALCON, FNP   5 months ago Palpitations   [facility] Gareth Mliss FALCON, FNP   6 months ago Chest pain, unspecified type   [facility] Gareth Mliss FALCON, [place]

## 2024-06-13 ENCOUNTER — Other Ambulatory Visit: Payer: Self-pay | Admitting: Nurse Practitioner

## 2024-06-16 NOTE — Telephone Encounter (Signed)
 Requested medication (s) are due for refill today: yes  Requested medication (s) are on the active medication list: yes  Last refill:  05/14/24  Future visit scheduled: no  Notes to clinic:  Unable to refill per protocol, cannot delegate.      Requested Prescriptions  Pending Prescriptions Disp Refills   clobetasol  (TEMOVATE ) 0.05 % external solution [Pharmacy Med Name: CLOBETASOL  PROPIONATE 0.05% TOP SOL] 50 mL 1    Sig: APPLY TO AFFECTED AREA(s) TWICE DAILY     Not Delegated - Dermatology:  Corticosteroids Failed - 06/16/2024 12:08 PM      Failed - This refill cannot be delegated      Passed - Valid encounter within last 12 months    Recent Outpatient Visits           2 months ago Weight loss, unintentional   [facility] Gareth Mliss FALCON, FNP   6 months ago Palpitations   [facility] Gareth Mliss FALCON, FNP   8 months ago Chest pain, unspecified type   [facility] Gareth Mliss FALCON, [place]

## 2024-07-04 ENCOUNTER — Encounter: Payer: Self-pay | Admitting: Emergency Medicine

## 2024-07-04 ENCOUNTER — Ambulatory Visit
Admission: EM | Admit: 2024-07-04 | Discharge: 2024-07-04 | Disposition: A | Discharge status: Home / Self Care | Attending: Emergency Medicine | Admitting: Emergency Medicine

## 2024-07-04 DIAGNOSIS — J029 Acute pharyngitis, unspecified: Secondary | ICD-10-CM | POA: Insufficient documentation

## 2024-07-04 DIAGNOSIS — J069 Acute upper respiratory infection, unspecified: Secondary | ICD-10-CM | POA: Diagnosis present

## 2024-07-04 LAB — POCT RAPID STREP A (OFFICE): Rapid Strep A Screen: NEGATIVE

## 2024-07-04 MED ORDER — AZITHROMYCIN 250 MG PO TABS: 250.0000 mg | ORAL_TABLET | Freq: Every day | ORAL | 0 refills | Status: AC

## 2024-07-04 NOTE — Discharge Instructions (Signed)
 Begin azithromycin  as directed for bacteria coverage as you have been sick for 3 weeks continuously  Rapid strep test is negative, has been sent to the lab to see bacterial growth and you will be notified if that occurred  take Tylenol  and/or Ibuprofen  as needed for fever reduction and pain relief.   For cough: honey 1/2 to 1 teaspoon (you can dilute the honey in water or another fluid).  You can also use guaifenesin and dextromethorphan for cough. You can use a humidifier for chest congestion and cough.  If you don't have a humidifier, you can sit in the bathroom with the hot shower running.      For sore throat: try warm salt water gargles, cepacol lozenges, throat spray, warm tea or water with lemon/honey, popsicles or ice, or OTC cold relief medicine for throat discomfort.   For congestion: take a daily anti-histamine like Zyrtec, Claritin, and a oral decongestant, such as pseudoephedrine.  You can also use Flonase 1-2 sprays in each nostril daily.   It is important to stay hydrated: drink plenty of fluids (water, gatorade/powerade/pedialyte, juices, or teas) to keep your throat moisturized and help further relieve irritation/discomfort.

## 2024-07-04 NOTE — ED Provider Notes (Signed)
 Gerald Davis    CSN: [id number] Arrival date & time: 07/04/24  1051      History   Chief Complaint Chief Complaint  Patient presents with   Sore Throat   Nasal Congestion    HPI Gerald Davis is Davis 23 y.o. male.   Patient presents for evaluations of chills, nasal congestion, sore throat, and Davis productive cough and vomiting due to coughing beginning 3 weeks ago.  Associated watery diarrhea.  History of asthma but denies shortness of breath and wheezing.  Tolerable to food and liquids.  Has attempted use of cough drops, Mucinex DM, Robitussin and [place] DM.  Exposure to influenza.  Past Medical History:  Diagnosis Date   ADHD (attention deficit hyperactivity disorder)    Agoraphobia    Allergy    Anxiety    Asthma    well controlled per mom   Bipolar 1 disorder (HCC)    Bipolar disorder (HCC)    Constipation    Depression    Hyperlipidemia    Hypertension    Obesity    PTSD (post-traumatic stress disorder)    Social anxiety disorder     Patient Active Problem List   Diagnosis Date Noted   Sinus arrhythmia seen on electrocardiogram 10/30/2023   Family history of heart disease 10/30/2023   Chest pain 10/30/2023   Palpitations 10/29/2023   Agoraphobia 04/09/2023   PTSD (post-traumatic stress disorder) 04/09/2023   Anxiety 04/09/2023   Bipolar 1 disorder (HCC) 04/09/2023   Hyperlipidemia 04/09/2023   Psoriasis 04/09/2023   Gastroesophageal reflux disease without esophagitis 04/09/2023   Class 3 severe obesity due to excess calories with serious comorbidity and body mass index (BMI) of 40.0 to 44.9 in adult (HCC) 04/09/2023   Oppositional defiant disorder 04/08/2014   PTSD (post-traumatic stress disorder) 04/08/2014   ADD (attention deficit disorder) 04/08/2014   Migraine without aura and without status migrainosus, not intractable 04/08/2014    Past Surgical History:  Procedure Laterality Date   CIRCUMCISION  2003   TONSILLECTOMY     TONSILLECTOMY  AND ADENOIDECTOMY Bilateral 08/08/2017   Procedure: TONSILLECTOMY AND POSSIBLE ADENOIDECTOMY;  Surgeon: Gerald Mt, MD;  Location: [facility] ORS;  Service: ENT;  Laterality: Bilateral;       Home Medications    Prior to Admission medications   Medication Sig Start Date End Date Taking? Authorizing Provider  diazepam (VALIUM) 10 MG tablet Take 10 mg by mouth 2 (two) times daily as needed. 06/26/24  Yes [provider]  acetaminophen  (TYLENOL ) 325 MG tablet Take 650 mg by mouth daily as needed for headache.    [provider]  albuterol (PROVENTIL HFA;VENTOLIN HFA) 108 (90 Base) MCG/ACT inhaler Inhale 2 puffs into the lungs every 6 (six) hours as needed for wheezing or shortness of breath.    [provider]  atomoxetine (STRATTERA) 40 MG capsule Take 40 mg by mouth daily.    [provider]  betamethasone valerate ointment (VALISONE) 0.1 % Apply 1 Application topically 2 (two) times daily. 04/09/23   Gerald Mliss FALCON, FNP  clobetasol  (TEMOVATE ) 0.05 % external solution APPLY TO AFFECTED AREA(s) TWICE DAILY 06/16/24   Gerald Davis, Gerald F, FNP  famotidine  (PEPCID ) 20 MG tablet Take 1 tablet (20 mg total) by mouth daily. 04/03/24 05/03/24  Gerald Gerald Davis FORBES, MD  gabapentin (NEURONTIN) 100 MG capsule Take 100 mg by mouth 2 (two) times daily.    [provider]  ibuprofen  (ADVIL ) 800 MG tablet Take 1 tablet (800 mg total)  by mouth every 8 (eight) hours as needed. 03/07/24   Gerald Davis, Gerald A, PA-C  JORNAY PM 60 MG CP24 Take 1 capsule by mouth at bedtime. 09/26/23   [provider]  Multiple Vitamin (MULTIVITAMIN WITH MINERALS) TABS tablet Take 1 tablet by mouth at bedtime.    [provider]  omeprazole  (PRILOSEC) 20 MG capsule TAKE 1 CAPSULE BY MOUTH ONCE DAILY 03/13/24   Gerald Davis, Gerald F, FNP  ondansetron  (ZOFRAN -ODT) 4 MG disintegrating tablet Take 1 tablet (4 mg total) by mouth every 8 (eight) hours as needed for nausea or vomiting. 04/03/24    Gerald Gerald Davis BRAVO, MD  QUEtiapine (SEROQUEL) 300 MG tablet Take 300 mg by mouth 2 (two) times daily.    [provider]  Suvorexant (BELSOMRA) 20 MG TABS Take 20 mg by mouth at bedtime as needed.    [provider]  vitamin C (ASCORBIC ACID) 500 MG tablet Take 500 mg by mouth at bedtime.    [provider]  vortioxetine HBr (TRINTELLIX) 10 MG TABS tablet Take 10 mg by mouth daily.    [provider]    Family History Family History  Problem Relation Age of Onset   Depression Mother    Anxiety disorder Mother    COPD Mother    Hyperlipidemia Mother    Hypertension Mother    OCD Mother    COPD Father    Hyperlipidemia Father    Hypertension Father    Anxiety disorder Father    Bipolar disorder Father    OCD Father    High blood pressure Mother    Migraines Mother    High blood pressure Father    Depression Father    Parkinson's disease Maternal Grandfather    Cirrhosis Maternal Grandfather    Dementia Maternal Grandfather    Migraines Maternal Grandfather    Depression Paternal Grandmother    Heart attack Paternal Grandfather     Social History Social History   Tobacco Use   Smoking status: Every Day    Types: E-cigarettes   Smokeless tobacco: Never  Vaping Use   Vaping status: Every Day   Substances: Nicotine, Flavoring  Substance Use Topics   Alcohol use: Never   Drug use: Never     Allergies   Ambien [zolpidem], Risperidone and paliperidone, Ambien [zolpidem tartrate], and Risperdal [risperidone]   Review of Systems Review of Systems  Constitutional:  Positive for chills. Negative for activity change, appetite change, diaphoresis, fatigue, fever and unexpected weight change.  HENT:  Positive for congestion and sore throat. Negative for dental problem, drooling, ear discharge, ear pain, facial swelling, hearing loss, mouth sores, nosebleeds, postnasal drip, rhinorrhea, sinus pressure, sinus pain, sneezing, tinnitus, trouble  swallowing and voice change.   Respiratory:  Positive for cough. Negative for apnea, choking, chest tightness, shortness of breath, wheezing and stridor.   Gastrointestinal:  Positive for vomiting. Negative for abdominal distention, abdominal pain, anal bleeding, blood in stool, constipation, diarrhea, nausea and rectal pain.  All other systems reviewed and are negative.    Physical Exam Triage Vital Signs ED Triage Vitals  Encounter Vitals Group     BP 07/04/24 1201 127/84     Girls Systolic BP Percentile --      Girls Diastolic BP Percentile --      Boys Systolic BP Percentile --      Boys Diastolic BP Percentile --      Pulse Rate 07/04/24 1201 60     Resp 07/04/24 1201 17  Temp 07/04/24 1201 98.5 Davis (36.9 C)     Temp Source 07/04/24 1201 Oral     SpO2 07/04/24 1201 98 %     Weight --      Height --      Head Circumference --      Peak Flow --      Pain Score 07/04/24 1205 4     Pain Loc --      Pain Education --      Exclude from Growth Chart --    No data found.  Updated Vital Signs BP 127/84 (BP Location: Left Arm)   Pulse 60   Temp 98.5 Davis (36.9 C) (Oral)   Resp 17   SpO2 98%   Visual Acuity Right Eye Distance:   Left Eye Distance:   Bilateral Distance:    Right Eye Near:   Left Eye Near:    Bilateral Near:     Physical Exam Constitutional:      Appearance: Normal appearance.  HENT:     Right Ear: Tympanic membrane, ear canal and external ear normal.     Left Ear: Tympanic membrane, ear canal and external ear normal.     Nose: Congestion present.     Mouth/Throat:     Pharynx: No oropharyngeal exudate or posterior oropharyngeal erythema.  Eyes:     Extraocular Movements: Extraocular movements intact.  Cardiovascular:     Rate and Rhythm: Normal rate and regular rhythm.     Pulses: Normal pulses.     Heart sounds: Normal heart sounds.  Pulmonary:     Effort: Pulmonary effort is normal.     Breath sounds: Normal breath sounds.   Musculoskeletal:     Cervical back: Normal range of motion and neck supple.  Neurological:     Mental Status: He is alert and oriented to person, place, and time. Mental status is at baseline.      UC Treatments / Results  Labs (all labs ordered are listed, but only abnormal results are displayed) Labs Reviewed  CULTURE, GROUP Davis STREP [facility])  POCT RAPID STREP Davis (OFFICE)    EKG   Radiology No results found.  Procedures Procedures (including critical care time)  Medications Ordered in UC Medications - No data to display  Initial Impression / Assessment and Plan / UC Course  I have reviewed the triage vital signs and the nursing notes.  Pertinent labs & imaging results that were available during my care of the patient were reviewed by me and considered in my medical decision making (see chart for details).  Acute URI  Patient is in no signs of distress nor toxic appearing.  Vital signs are stable.  Low suspicion for pneumonia, pneumothorax or bronchitis and therefore will defer imaging.  Rapid strep negative, sent for culture, discussed findings.  Symptoms initially beginning 3 weeks ago with influenza exposure, due to timeline will defer testing.  Prescribed azithromycin .May use additional over-the-counter medications as needed for supportive care.  May follow-up with urgent care as needed if symptoms persist or worsen.   Final Clinical Impressions(s) / UC Diagnoses   Final diagnoses:  Sore throat   Discharge Instructions   None    ED Prescriptions   None    PDMP not reviewed this encounter.   Teresa Shelba SAUNDERS, NP 07/04/24 1301

## 2024-07-04 NOTE — ED Triage Notes (Signed)
 Patient reports sore throat and runny nose x  3 days. Patient has been using cough drops with no relief.  Rates pain 4/10.

## 2024-07-06 LAB — CULTURE, GROUP A STREP (THRC)

## 2024-07-07 ENCOUNTER — Ambulatory Visit (HOSPITAL_COMMUNITY): Payer: Self-pay

## 2024-08-18 ENCOUNTER — Telehealth: Payer: Self-pay | Admitting: Nurse Practitioner

## 2024-08-18 NOTE — Telephone Encounter (Unsigned)
 Copied from CRM [id number]. Topic: General - Other >> Aug 18, 2024 12:21 PM Avram MATSU wrote: Reason for CRM: patient stated his health insurance faxed a letter that needs a provider signature, please give call once its been faxed back over.  Please advise Home [phone] stated its urgent.

## 2024-08-19 NOTE — Telephone Encounter (Unsigned)
 Copied from CRM #[id number]. Topic: General - Other >> Aug 19, 2024  9:08 AM Willma SAUNDERS wrote: Reason for CRM: Patients insurance faxed a form over yesterday morning to be filled out and faxed back. Patients dad Matilda would like to confirm if this has been done.  Matilda can be reached at [phone]

## 2024-08-19 NOTE — Telephone Encounter (Unsigned)
 Copied from CRM [id number]. Topic: General - Other >> Aug 18, 2024 12:21 PM Avram MATSU wrote: Reason for CRM: patient stated his health insurance faxed a letter that needs a provider signature, please give call once its been faxed back over.  Please advise Home [phone] stated its urgent. >> Aug 18, 2024  4:17 PM Winona R wrote: Pt mom calling to verify the status of the letter faxed to the office by the pt insurance provider. Pt mom would like NP Penders nurse to give her a call to confirm it has been received

## 2024-08-27 ENCOUNTER — Ambulatory Visit: Payer: Self-pay

## 2024-08-27 NOTE — Telephone Encounter (Signed)
 FYI Only or Action Required?: FYI only for provider: appointment scheduled on 08/28/24.  Patient was last seen in primary care on 04/07/2024 by Gareth Mliss FALCON, FNP.  Called Nurse Triage reporting Vomiting.  Symptoms began yesterday.  Interventions attempted: OTC medications: Robitussin.  Symptoms are: stable.  Triage Disposition: Home Care  Patient/caregiver understands and will follow disposition?: Yes  Reason for Disposition  MILD vomiting with diarrhea  Answer Assessment - Initial Assessment Questions Taken robitussin DM last night and migraine medication. Pt reports fiance took temp yesterday and 1 cheek was over 100 and the other cheek was normal.   1. VOMITING SEVERITY: How many times have you vomited in the past 24 hours?      2 times  2. ONSET: When did the vomiting begin?      Yesterday  3. FLUIDS: What fluids or food have you vomited up today? Have you been able to keep any fluids down?     Eating and drinking normally  4. ABDOMEN PAIN: Are your having any abdomen pain? If Yes : How bad is it and what does it feel like? (e.g., crampy, dull, intermittent, constant)      Denies  5. DIARRHEA: Is there any diarrhea? If Yes, ask: How many times today?      1 time yesterday, completely water  6. OTHER SYMPTOMS: Do you have any other symptoms? (e.g., fever, headache, vertigo, vomiting blood or coffee grounds, recent head injury)     Chest pains x1 year, wore a heart monitor previously, no cardiac conditions found. Chills, headache, cough  Protocols used: Vomiting-A-AH  Copied from CRM #[id number]. Topic: Clinical - Red Word Triage >> Aug 27, 2024 10:52 AM Joesph NOVAK wrote: Red Word that prompted transfer to Nurse Triage: Severe pain headaches, congestion, vomiting 3 times the last two days.

## 2024-08-28 ENCOUNTER — Encounter: Payer: Self-pay | Admitting: Nurse Practitioner

## 2024-08-28 ENCOUNTER — Ambulatory Visit (INDEPENDENT_AMBULATORY_CARE_PROVIDER_SITE_OTHER): Admitting: Nurse Practitioner

## 2024-08-28 VITALS — BP 138/82 | HR 62 | Temp 98.0°F | Ht 75.0 in | Wt 288.0 lb

## 2024-08-28 DIAGNOSIS — J101 Influenza due to other identified influenza virus with other respiratory manifestations: Secondary | ICD-10-CM

## 2024-08-28 DIAGNOSIS — R051 Acute cough: Secondary | ICD-10-CM

## 2024-08-28 LAB — POC COVID19/FLU A&B COMBO
Covid Antigen, POC: NEGATIVE
Influenza A Antigen, POC: POSITIVE — AB
Influenza B Antigen, POC: NEGATIVE

## 2024-08-28 MED ORDER — OSELTAMIVIR PHOSPHATE 75 MG PO CAPS: 75.0000 mg | ORAL_CAPSULE | Freq: Two times a day (BID) | ORAL | 0 refills | Status: AC

## 2024-08-28 MED ORDER — ALBUTEROL SULFATE HFA 108 (90 BASE) MCG/ACT IN AERS: 2.0000 | INHALATION_SPRAY | Freq: Four times a day (QID) | RESPIRATORY_TRACT | 1 refills | Status: AC | PRN

## 2024-08-28 NOTE — Progress Notes (Signed)
 "  BP 138/82   Pulse 62   Temp 98 F (36.7 C)   Ht 6' 3 (1.905 m)   Wt 288 lb (130.6 kg)   SpO2 96%   BMI 36.00 kg/m    Subjective:    Patient ID: Gerald Davis, male    DOB: February 22, 2001, 24 y.o.   MRN: [id number]  HPI: Gerald Davis is a 24 y.o. male  Chief Complaint  Patient presents with   Cough    Pt c/o cough, vomitting, fatigue, fever x3 days.    Discussed the use of AI scribe software for clinical note transcription with the patient, who gave verbal consent to proceed.  History of Present Illness Gerald Davis is a 24 year old male who presents with cough, vomiting, fatigue, and fever for three days.  URI symptoms - Cough, sinus congestion, body aches, fever and fatigue present for two days - No recent use of inhaler - Previous use of albuterol   Gastrointestinal symptoms - nausea/ Vomiting for two days  Constitutional symptoms - Fatigue for three days - Fever for three days  Health-related anxiety - Concerned about duration of symptoms, particularly cough - Expresses anxiety about being around people with cancer and about his health         05/10/2023    2:11 PM 04/09/2023    1:39 PM  Depression screen PHQ 2/9  Decreased Interest 0 0  Down, Depressed, Hopeless 1 0  PHQ - 2 Score 1 0  Altered sleeping 1 3  Tired, decreased energy 1 2  Change in appetite 0 0  Feeling bad or failure about yourself  0 0  Trouble concentrating 0 2  Moving slowly or fidgety/restless 1 0  Suicidal thoughts 0 0  PHQ-9 Score 4  7   Difficult doing work/chores Somewhat difficult Somewhat difficult     Data saved with a previous flowsheet row definition    Relevant past medical, surgical, family and social history reviewed and updated as indicated. Interim medical history since our last visit reviewed. Allergies and medications reviewed and updated.  Review of Systems  Ten systems reviewed and is negative except as mentioned in HPI      Objective:      BP  138/82   Pulse 62   Temp 98 F (36.7 C)   Ht 6' 3 (1.905 m)   Wt 288 lb (130.6 kg)   SpO2 96%   BMI 36.00 kg/m    Wt Readings from Last 3 Encounters:  08/28/24 288 lb (130.6 kg)  04/07/24 (!) 315 lb 4.8 oz (143 kg)  04/03/24 (!) 330 lb (149.7 kg)    Physical Exam GENERAL: Alert, cooperative, well developed, no acute distress HEENT: Normocephalic, normal oropharynx, moist mucous membranes CHEST: Clear to auscultation bilaterally, no wheezes, rhonchi, or crackles, lungs normal CARDIOVASCULAR: Normal heart rate and rhythm, S1 and S2 normal without murmurs ABDOMEN: Soft, non-tender, non-distended, without organomegaly, normal bowel sounds EXTREMITIES: No cyanosis or edema NEUROLOGICAL: Cranial nerves grossly intact, moves all extremities without gross motor or sensory deficit  Results for orders placed or performed in visit on 08/28/24  POC Covid19/Flu A&B Antigen   Collection Time: 08/28/24  1:11 PM  Result Value Ref Range   Influenza A Antigen, POC Positive (A) Negative   Influenza B Antigen, POC Negative Negative   Covid Antigen, POC Negative Negative          Assessment & Plan:   Problem List Items Addressed This Visit  None Visit Diagnoses       Acute cough    -  Primary   Relevant Medications   oseltamivir  (TAMIFLU ) 75 MG capsule   albuterol  (VENTOLIN  HFA) 108 (90 Base) MCG/ACT inhaler   Other Relevant Orders   POC Covid19/Flu A&B Antigen (Completed)     Influenza A       Relevant Medications   oseltamivir  (TAMIFLU ) 75 MG capsule        Assessment and Plan Assessment & Plan Influenza A Acute influenza A infection confirmed by positive test. Symptoms include cough, vomiting, fatigue, and fever for three days. Symptoms began two days ago. Discussed potential for cough to linger for up to six weeks. - Prescribed Tamiflu , one tablet in the morning and one at night for five days. - Advised symptomatic treatment with acetaminophen  or ibuprofen  for body aches  and fever. - Recommended guaifenesin and dextromethorphan for cough. - Suggested an antihistamine and fluticasone for nasal congestion. - Encouraged hydration with fluids and electrolytes such as Gatorade.  Asthma Management discussed. He has not had an inhaler in a while. - Prescribed albuterol  inhaler.        Follow up plan: Return if symptoms worsen or fail to improve. "
